# Patient Record
Sex: Male | Born: 1965 | Race: Black or African American | Hispanic: No | Marital: Single | Smoking: Never smoker
Health system: Southern US, Community
[De-identification: ages and names within clinical notes are randomized; demographics above are authoritative.]

---

## 2011-10-11 ENCOUNTER — Encounter (HOSPITAL_COMMUNITY): Payer: Self-pay | Admitting: Emergency Medicine

## 2011-10-11 ENCOUNTER — Emergency Department (HOSPITAL_COMMUNITY)
Admission: EM | Admit: 2011-10-11 | Discharge: 2011-10-11 | Disposition: A | Discharge status: Home / Self Care | Payer: Self-pay | Source: Home / Self Care | Attending: Family Medicine | Admitting: Family Medicine

## 2011-10-11 DIAGNOSIS — B354 Tinea corporis: Secondary | ICD-10-CM

## 2011-10-11 MED ORDER — KETOCONAZOLE 2 % EX CREA: TOPICAL_CREAM | Freq: Every day | CUTANEOUS | Status: AC

## 2011-10-11 MED ORDER — FLUCONAZOLE 150 MG PO TABS: ORAL_TABLET | ORAL | Status: AC

## 2011-10-11 NOTE — Discharge Instructions (Signed)
Use over-the-counter red top Selsun Blue shampoo apply in the affected areas and let stay overnight rinse in the morning dry well and apply the prescribed cream. Take the prescribed medications as instructed. Followup with a dermatologist if persistent rash despite completing treatment or followup earlier if worsening symptoms at any point.

## 2011-10-11 NOTE — ED Notes (Signed)
Pt has darkened rash areas on skin, there are no raised places, some places appear dry. Pt states it has been there for about 4 weeks, but has gotten slowly worse. Pt states he has eliminated or changed all soaps and lotions. No c/o pain or itching.

## 2011-10-13 NOTE — ED Provider Notes (Signed)
History     CSN: [id number]  Arrival date & time 10/11/11  1653   First MD Initiated Contact with Patient 10/11/11 1711      Chief Complaint  Patient presents with  . Rash    (Consider location/radiation/quality/duration/timing/severity/associated sxs/prior treatment) HPI Comments: 46 y/o male no significant PMH. Here c/o rash in arms and torso for about 4 weeks, minimal itching. Getting worse. No fever or chills. No general malaise. No known chemical exposure. No new drugs. Otherwise feeling well. No using any medication to treat his rash.    History reviewed. No pertinent past medical history.  History reviewed. No pertinent past surgical history.  History reviewed. No pertinent family history.  History  Substance Use Topics  . Smoking status: Never Smoker   . Smokeless tobacco: Not on file  . Alcohol Use: Yes     occasional      Review of Systems  Constitutional: Negative for fever and chills.  HENT: Negative for sore throat and trouble swallowing.   Respiratory: Negative for cough and shortness of breath.   Musculoskeletal: Negative for myalgias and arthralgias.  Skin: Positive for rash.       As per HPI  Neurological: Negative for headaches.  All other systems reviewed and are negative.    Allergies  Review of patient's allergies indicates no known allergies.  Home Medications   Current Outpatient Rx  Name Route Sig Dispense Refill  . FLUCONAZOLE 150 MG PO TABS  1 tab po weekly x4 4 tablet 0  . KETOCONAZOLE 2 % EX CREA Topical Apply topically daily. 15 g 0    BP 120/71  Pulse 94  Temp(Src) 98.8 F (37.1 C) (Oral)  Resp 18  SpO2 98%  Physical Exam  Nursing note and vitals reviewed. Constitutional: He is oriented to person, place, and time. He appears well-developed and well-nourished. No distress.  HENT:  Head: Normocephalic and atraumatic.  Mouth/Throat: Oropharynx is clear and moist. No oropharyngeal exudate.  Eyes: Conjunctivae are normal.  Pupils are equal, round, and reactive to light.  Neck: Neck supple.  Cardiovascular: Normal heart sounds.   Pulmonary/Chest: Breath sounds normal.  Abdominal: Soft. There is no tenderness.  Lymphadenopathy:    He has no cervical adenopathy.  Neurological: He is alert and oriented to person, place, and time.  Skin:       Macular round hyperpigmented lesions in sun exposed area. Confluent in mid upper back and V neck and anterior chest area. In some area lesions are dry and peeling.     ED Course  Procedures (including critical care time)  Labs Reviewed - No data to display No results found.   1. Tinea corporis       MDM  Treated with diflucan, ketoconazole and supportive care recommendations.        Sharin Grave, MD 10/14/11 [phone]

## 2021-07-05 ENCOUNTER — Ambulatory Visit: Admit: 2021-07-05 | Discharge status: Absent without leave | Payer: Self-pay

## 2021-07-06 ENCOUNTER — Ambulatory Visit
Admission: RE | Admit: 2021-07-06 | Discharge: 2021-07-06 | Disposition: A | Discharge status: Home / Self Care | Payer: Medicaid Other | Source: Ambulatory Visit | Attending: Physician Assistant | Admitting: Physician Assistant

## 2021-07-06 ENCOUNTER — Encounter (HOSPITAL_COMMUNITY): Payer: Self-pay | Admitting: Registered Nurse

## 2021-07-06 ENCOUNTER — Ambulatory Visit (HOSPITAL_COMMUNITY)
Admission: EM | Admit: 2021-07-06 | Discharge: 2021-07-06 | Disposition: A | Discharge status: Home / Self Care | Payer: No Payment, Other | Attending: Registered Nurse | Admitting: Registered Nurse

## 2021-07-06 ENCOUNTER — Other Ambulatory Visit: Payer: Self-pay

## 2021-07-06 VITALS — BP 135/87 | HR 90 | Temp 99.1°F | Resp 18

## 2021-07-06 DIAGNOSIS — R109 Unspecified abdominal pain: Secondary | ICD-10-CM

## 2021-07-06 DIAGNOSIS — Z634 Disappearance and death of family member: Secondary | ICD-10-CM | POA: Insufficient documentation

## 2021-07-06 DIAGNOSIS — Z636 Dependent relative needing care at home: Secondary | ICD-10-CM | POA: Insufficient documentation

## 2021-07-06 DIAGNOSIS — F4323 Adjustment disorder with mixed anxiety and depressed mood: Secondary | ICD-10-CM | POA: Diagnosis present

## 2021-07-06 NOTE — Discharge Instructions (Signed)
°  Please use your MyChart to find/ set up appointment with PCP in the near future.     You can visit the behavioral health urgent care at :  [address] [place], [place] [address] Phone: [phone]

## 2021-07-06 NOTE — ED Provider Notes (Signed)
Behavioral Health Urgent Care Medical Screening Exam  Patient Name: Chad Maiserrence Lingo MRN: [id number] Date of Evaluation: 07/06/21 Chief Complaint:   Diagnosis:  Final diagnoses:  Adjustment disorder with mixed anxiety and depressed mood    History of Present illness: Chad Keller is a 56 y.o. male patient [facility] voluntarily as a walk in.    Chad Keller, 56 y.o., male patient seen face to face by this provider, consulted with Dr. Earlene PlaterKatherine Laubach; and chart reviewed on 07/06/21.  On evaluation Chad Keller reports he was [facility] for abdominal discomfort.  Reported having anxiety and was suggested to follow up with outpatient psychiatric services.   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  Reports his mother died last year from abdominal cancer and never really had time to grieve her loss.  States immediately after the death of his mother his brother who was diagnosed with schizophrenia mood began and he became his caregiver.  Reports he has 3 children that are doing well a son who is currently living with his ex-wife who is doing well but at times may need him to do things and with the being an hour away having to stop what he is doing to go help.  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  So I just dropped everything hit up there and take him to school."  Patient reporting that he has had worsening anxiety but has never sought any help.  Reports he has talked to friends about what they did for the anxiety and was recommended to see a doctor for medication and therapy.  Patient denies prior psychiatric history (no outpatient psychiatric services, psychiatric hospitalizations, no prior self-harm behaviors, or suicide  attempt).  Patient reports he is going to get established with outpatient psychiatric services for medication management and counseling. During evaluation Chad Keller is sitting upright in chair in no acute distress.  He is alert/oriented x 4; calm/cooperative; and mood congruent with affect.  He is speaking in a clear tone at moderate volume, and normal pace; with good eye contact.  His thought process is coherent and relevant; There is no indication that he is currently responding to internal/external stimuli or experiencing delusional thought content; and he denies suicidal/self-harm/homicidal ideation, psychosis, and paranoia.  Patient has remained calm throughout assessment and has answered questions appropriately.    At this time Chad Keller is educated and verbalizes understanding of mental health resources and other crisis services in the community. He is instructed to call 911 and present to the nearest emergency room should he experience any suicidal/homicidal ideation, auditory/visual/hallucinations, or detrimental worsening of his mental health condition.  He was a also advised by Clinical research associatewriter that he could call the toll-free phone on insurance card to assist with identifying in network counselors and agencies or number on back of Medicaid card to speak with care coordinator    Psychiatric Specialty Exam  Presentation  General Appearance:Appropriate for Environment; Casual  Eye Contact:Good  Speech:Clear and Coherent; Normal Rate  Speech Volume:Normal  Handedness:Right   Mood and Affect  Mood:Anxious; Euthymic  Affect:Appropriate; Congruent   Thought Process  Thought Processes:Coherent; Goal Directed  Descriptions of Associations:Intact  Orientation:Full (Time, Place and Person)  Thought Content:Logical; WDL    Hallucinations:None  Ideas of Reference:None  Suicidal Thoughts:No  Homicidal Thoughts:No   Sensorium  Memory:Immediate Good; Recent Good; Remote  Good  Judgment:Intact  Insight:Present   Executive Functions  Concentration:Good  Attention Span:Good  [place]  Language:Good   Psychomotor Activity  Psychomotor Activity:Normal   Assets  Assets:Communication Skills; Desire for Improvement; Housing; Resilience; Social Support   Sleep  Sleep:Fair  Number of hours: No data recorded  Nutritional Assessment (For OBS and FBC admissions only) Has the patient had a weight loss or gain of 10 pounds or more in the last 3 months?: No Has the patient had a decrease in food intake/or appetite?: No Does the patient have dental problems?: No Does the patient have eating habits or behaviors that may be indicators of an eating disorder including binging or inducing vomiting?: No Has the patient recently lost weight without trying?: 0 Has the patient been eating poorly because of a decreased appetite?: 0 Malnutrition Screening Tool Score: 0    Physical Exam: Physical Exam Vitals and nursing note reviewed. Exam conducted with a chaperone present.  Constitutional:      General: He is not in acute distress.    Appearance: Normal appearance. He is not ill-appearing.  Cardiovascular:     Rate and Rhythm: Normal rate.  Pulmonary:     Effort: Pulmonary effort is normal.  Musculoskeletal:        General: Normal range of motion.     Cervical back: Normal range of motion.  Skin:    General: Skin is warm and dry.  Neurological:     Mental Status: He is alert and oriented to person, place, and time.  Psychiatric:        Attention and Perception: Attention and perception normal. He does not perceive auditory or visual hallucinations.        Mood and Affect: Affect normal. Mood is anxious.        Speech: Speech normal.        Behavior: Behavior normal. Behavior is cooperative.        Thought Content: Thought content normal. Thought content is not paranoid or delusional. Thought content does not include  homicidal or suicidal ideation.        Cognition and Memory: Cognition and memory normal.        Judgment: Judgment normal.   Review of Systems  Constitutional: Negative.   HENT: Negative.    Eyes: Negative.   Respiratory: Negative.    Cardiovascular: Negative.   Gastrointestinal:  Positive for abdominal pain.       Complaints of abdominal discomfort that started about a month ago.  Genitourinary: Negative.   Musculoskeletal: Negative.   Skin: Negative.   Neurological: Negative.   Endo/Heme/Allergies: Negative.   Blood pressure 118/86, pulse 80, temperature 98.6 F (37 C), temperature source Oral, resp. rate 18, SpO2 97 %. There is no height or weight on file to calculate BMI.  Musculoskeletal: Strength & Muscle Tone: within normal limits Gait & Station: normal Patient leans: N/A   [place] [facility] Discharge Disposition for Follow up and Recommendations: Based on my evaluation the patient does not appear to have an emergency medical condition and can be discharged with resources and follow up care in outpatient services for Medication Management, Individual Therapy, Group Therapy, and Intensive outpatient program   Follow-up Information     Call  [place]: Professional Counselor Why: schedule an appointment for medication management and therapy Contact information: [facility] [place] [address] [place]   Specialty: Urgent Care Why: New Therapy walkin:  Monday-Wednesday from 7:30am-12:30pm.  New Medication management walkin Monday-Friday from 7:30 am to 11:00am.  Patient will be taken in the order that they come.  You may not be seen on the same day as walkin. first come first Midwife information: Rico Radcliff                 Discharge Instructions      [facility]:  First come first serve.  If you arrive to late.  Ask if you can put your name down in walk in slot for next.    Outpatient treatment is recommended:  [facility] (2nd floor) [address] [phone]  [facility] [place], [place] [address] [phone]  [facility] [address] [place], [place] [address] Phone: [phone]        Earleen Newport, NP 07/06/2021, 6:22 PM

## 2021-07-06 NOTE — Discharge Instructions (Addendum)
[facility]:  First come first serve.  If you arrive to late.  Ask if you can put your name down in walk in slot for next.    Outpatient treatment is recommended:  [facility] (2nd floor) [address] [phone]  [facility] [address] [place], [place] [address] [phone]  [facility] [address] [place], [place] [address] Phone: [phone]

## 2021-07-06 NOTE — Progress Notes (Signed)
°   07/06/21 1746  [facility] Triage Screening (Walk-ins at [facility] only)  How Did You Hear About [place]? Self  What Is the Reason for Your Visit/Call Today? Patient presents at the recommendation of [facility].  He had presented there with report of abdominal pain.  Patient lost his mother to stomach cancer and he has experienced significant anxiety related to the possiblility of a cancer diagnosis.  He has also had to take on care of his brother who is diagnosed with Schizophrenis since their mother passed away.  He shares that he has a lot going on with his children in the midst of losing his mother and dealing with his brother.  Two daughters have graduated college and he helps a great deal with his son who now lives with his mother in [place] and is a Holiday representative in high school.  Patient spoke with the UC provider about possible medications for anxiety and he was referred here.  Patient denies SI, HI, AVH or SA hx.  He is open to outpatient treatment recommendations.  How Long Has This Been Causing You Problems? > than 6 months  Have You Recently Had Any Thoughts About Hurting Yourself? No  Are You Planning to Commit Suicide/Harm Yourself At This time? No  Have you Recently Had Thoughts About Hurting Someone Karolee Ohs? No  Are You Planning To Harm Someone At This Time? No  Are you currently experiencing any auditory, visual or other hallucinations? No  Have You Used Any Alcohol or Drugs in the Past 24 Hours? No  Do you have any current medical co-morbidities that require immediate attention? No  Clinician description of patient physical appearance/behavior: Patient is calm, cooperative and pleasant, AAOx5  What Do You Feel Would Help You the Most Today? Treatment for Depression or other mood problem  If access to [facility] was not available, would you have sought care in the Emergency Department? No  Determination of Need Routine (7 days)  Options For Referral Medication  Management;Outpatient Therapy

## 2021-07-06 NOTE — ED Triage Notes (Signed)
Pt c/o:  Abdominal "gurgling" "growling" and "tweak in my back" (pointing to right lower back).   Mentions hx of gallstones and several recent stressors in life. States mother passed away of tumor in abd. Pt appears anxious of this scenario.   States his sx have been going on for a few months.

## 2021-07-06 NOTE — ED Notes (Signed)
Patient received After visit summary with community resources for follow up care. Patient verbalized understanding. Patient received all belongings from locker. Patient denies SI, Hi and AVH at time of discharge.

## 2021-07-06 NOTE — ED Provider Notes (Signed)
[facility]    CSN: [id number] Arrival date & time: 07/06/21  1459      History   Chief Complaint Chief Complaint  Patient presents with   Abdominal Pain    HPI Teagan Heidrick is a 56 y.o. male.   Patient here today for evaluation of abdominal discomfort that started a few months ago. He denies pain but reports more of a constant "gurgling" or "growling". He has not had any nausea, vomiting, diarrhea or constipation. He denies blood in his stool. He has not taken any medication for symptoms. He has history of gallstones. He is most concerned because his mother passed from abdominal tumor recently. (Leiomyosarcoma)   He also reports anxiety and would like to consider initiating treatment for same.   The history is provided by the patient.  Abdominal Pain Associated symptoms: no chills, no constipation, no diarrhea, no fever, no nausea, no shortness of breath and no vomiting    History reviewed. No pertinent past medical history.  There are no problems to display for this patient.   History reviewed. No pertinent surgical history.     Home Medications    Prior to Admission medications   Medication Sig Start Date End Date Taking? Authorizing Provider  fluconazole (DIFLUCAN) 150 MG tablet 1 tab po weekly x4 10/11/11   Moreno-Coll, Adlih, MD    Family History History reviewed. No pertinent family history.  Social History Social History   Tobacco Use   Smoking status: Never  Substance Use Topics   Alcohol use: Yes    Comment: occasional   Drug use: No     Allergies   Patient has no known allergies.   Review of Systems Review of Systems  Constitutional:  Negative for chills and fever.  Eyes:  Negative for discharge and redness.  Respiratory:  Negative for shortness of breath.   Gastrointestinal:  Positive for abdominal pain. Negative for blood in stool, constipation, diarrhea, nausea and vomiting.  Skin:  Positive for color change and wound.   Neurological:  Negative for numbness.    Physical Exam Triage Vital Signs ED Triage Vitals  Enc Vitals Group     BP 07/06/21 1515 135/87     Pulse Rate 07/06/21 1515 90     Resp 07/06/21 1515 18     Temp 07/06/21 1515 99.1 F (37.3 C)     Temp Source 07/06/21 1515 Oral     SpO2 07/06/21 1515 98 %     Weight --      Height --      Head Circumference --      Peak Flow --      Pain Score 07/06/21 1514 0     Pain Loc --      Pain Edu? --      Excl. in GC? --    No data found.  Updated Vital Signs BP 135/87 (BP Location: Left Arm)    Pulse 90    Temp 99.1 F (37.3 C) (Oral)    Resp 18    SpO2 98%      Physical Exam Vitals and nursing note reviewed.  Constitutional:      General: He is not in acute distress.    Appearance: Normal appearance. He is not ill-appearing.  HENT:     Head: Normocephalic and atraumatic.  Eyes:     Conjunctiva/sclera: Conjunctivae normal.  Cardiovascular:     Rate and Rhythm: Normal rate and regular rhythm.     Heart sounds:  Normal heart sounds. No murmur heard. Pulmonary:     Effort: Pulmonary effort is normal. No respiratory distress.     Breath sounds: Normal breath sounds. No wheezing, rhonchi or rales.  Abdominal:     General: Abdomen is flat. Bowel sounds are normal. There is no distension.     Palpations: Abdomen is soft.     Tenderness: There is no abdominal tenderness. There is no guarding or rebound.  Neurological:     Mental Status: He is alert.  Psychiatric:        Mood and Affect: Mood normal.        Behavior: Behavior normal.        Thought Content: Thought content normal.     UC Treatments / Results  Labs (all labs ordered are listed, but only abnormal results are displayed) Labs Reviewed  CBC WITH DIFFERENTIAL/PLATELET  COMPREHENSIVE METABOLIC PANEL  AMYLASE  LIPASE    EKG   Radiology No results found.  Procedures Procedures (including critical care time)  Medications Ordered in UC Medications - No data  to display  Initial Impression / Assessment and Plan / UC Course  I have reviewed the triage vital signs and the nursing notes.  Pertinent labs & imaging results that were available during my care of the patient were reviewed by me and considered in my medical decision making (see chart for details).   Seemingly benign abdominal symptoms but will order labs for further recommendation. I encouraged patient to establish with PCP as well. Contact information provided for behavioral health urgent care for further anxiety management. Encouraged follow up with any further concerns.   Final Clinical Impressions(s) / UC Diagnoses   Final diagnoses:  Abdominal discomfort     Discharge Instructions       Please use your MyChart to find/ set up appointment with PCP in the near future.     You can visit the behavioral health urgent care at :  [address] [place], [place] [address] Phone: [phone]    ED Prescriptions   None    PDMP not reviewed this encounter.   Tomi Bamberger, PA-C 07/06/21 1701

## 2021-07-07 LAB — CBC WITH DIFFERENTIAL/PLATELET
Basophils Absolute: 0.1 10*3/uL (ref 0.0–0.2)
Basos: 1 %
EOS (ABSOLUTE): 0.2 10*3/uL (ref 0.0–0.4)
Eos: 3 %
Hematocrit: 44.5 % (ref 37.5–51.0)
Hemoglobin: 14.9 g/dL (ref 13.0–17.7)
Immature Grans (Abs): 0 10*3/uL (ref 0.0–0.1)
Immature Granulocytes: 0 %
Lymphocytes Absolute: 2.6 10*3/uL (ref 0.7–3.1)
Lymphs: 50 %
MCH: 28.4 pg (ref 26.6–33.0)
MCHC: 33.5 g/dL (ref 31.5–35.7)
MCV: 85 fL (ref 79–97)
Monocytes Absolute: 0.4 10*3/uL (ref 0.1–0.9)
Monocytes: 8 %
Neutrophils Absolute: 2 10*3/uL (ref 1.4–7.0)
Neutrophils: 38 %
Platelets: 196 10*3/uL (ref 150–450)
RBC: 5.25 x10E6/uL (ref 4.14–5.80)
RDW: 13.1 % (ref 11.6–15.4)
WBC: 5.2 10*3/uL (ref 3.4–10.8)

## 2021-07-07 LAB — COMPREHENSIVE METABOLIC PANEL
ALT: 27 IU/L (ref 0–44)
AST: 19 IU/L (ref 0–40)
Albumin/Globulin Ratio: 1.2 (ref 1.2–2.2)
Albumin: 4.4 g/dL (ref 3.8–4.9)
Alkaline Phosphatase: 71 IU/L (ref 44–121)
BUN/Creatinine Ratio: 12 (ref 9–20)
BUN: 12 mg/dL (ref 6–24)
Bilirubin Total: 0.3 mg/dL (ref 0.0–1.2)
CO2: 22 mmol/L (ref 20–29)
Calcium: 9.4 mg/dL (ref 8.7–10.2)
Chloride: 102 mmol/L (ref 96–106)
Creatinine, Ser: 0.99 mg/dL (ref 0.76–1.27)
Globulin, Total: 3.6 g/dL (ref 1.5–4.5)
Glucose: 106 mg/dL — ABNORMAL HIGH (ref 70–99)
Potassium: 3.7 mmol/L (ref 3.5–5.2)
Sodium: 139 mmol/L (ref 134–144)
Total Protein: 8 g/dL (ref 6.0–8.5)
eGFR: 91 mL/min/{1.73_m2} (ref >59)

## 2021-07-07 LAB — AMYLASE: Amylase: 68 U/L (ref 31–110)

## 2021-07-07 LAB — LIPASE: Lipase: 19 U/L (ref 13–78)

## 2021-07-08 ENCOUNTER — Ambulatory Visit (INDEPENDENT_AMBULATORY_CARE_PROVIDER_SITE_OTHER): Payer: No Payment, Other | Admitting: Licensed Clinical Social Worker

## 2021-07-08 ENCOUNTER — Other Ambulatory Visit: Payer: Self-pay

## 2021-07-08 DIAGNOSIS — Z634 Disappearance and death of family member: Secondary | ICD-10-CM

## 2021-07-08 DIAGNOSIS — F411 Generalized anxiety disorder: Secondary | ICD-10-CM | POA: Diagnosis not present

## 2021-07-12 ENCOUNTER — Other Ambulatory Visit: Payer: Self-pay

## 2021-07-12 ENCOUNTER — Ambulatory Visit (INDEPENDENT_AMBULATORY_CARE_PROVIDER_SITE_OTHER): Payer: No Payment, Other | Admitting: Psychiatry

## 2021-07-12 ENCOUNTER — Encounter (HOSPITAL_COMMUNITY): Payer: Self-pay | Admitting: Psychiatry

## 2021-07-12 DIAGNOSIS — F411 Generalized anxiety disorder: Secondary | ICD-10-CM | POA: Diagnosis not present

## 2021-07-12 MED ORDER — HYDROXYZINE HCL 10 MG PO TABS: 10.0000 mg | ORAL_TABLET | Freq: Three times a day (TID) | ORAL | 0 refills | Status: DC | PRN

## 2021-07-12 MED ORDER — FLUOXETINE HCL 10 MG PO CAPS: 10.0000 mg | ORAL_CAPSULE | Freq: Every day | ORAL | 2 refills | Status: DC

## 2021-07-12 MED ORDER — HYDROXYZINE HCL 10 MG PO TABS: 10.0000 mg | ORAL_TABLET | Freq: Three times a day (TID) | ORAL | 3 refills | Status: DC | PRN

## 2021-07-12 NOTE — Progress Notes (Signed)
Psychiatric Initial Adult Assessment  Virtual Visit via Video Note  I connected with Chad Maiserrence Mendez on 07/12/21 at  8:00 AM EST by a video enabled telemedicine application and verified that I am speaking with the correct person using two identifiers.  Location: Patient: Home Provider: Clinic   I discussed the limitations of evaluation and management by telemedicine and the availability of in person appointments. The patient expressed understanding and agreed to proceed.  I provided 45 minutes of non-face-to-face time during this encounter.   Patient Identification: Chad Keller MRN:  [id number] Date of Evaluation:  07/12/2021 Referral Source: [facility] Complaint:  "When It comes to work or studying I start worrying" Visit Diagnosis:    ICD-10-CM   1. Generalized anxiety disorder  F41.1 FLUoxetine (PROZAC) 10 MG capsule    hydrOXYzine (ATARAX) 10 MG tablet      History of Present Illness:  56 year old male seen today for initial psychiatric evaluation. [facility] presented with increased anxiety and symptoms of adjustment disorder. Currently he is not managed on medications.   Today he is well groomed, pleasant, cooperative,  engaged in conversation, and maintained eye contact. He informed Clinical research associatewriter that at work or in his quite study times he has increased anxiety and worry. He notes that he worries about his three children and completing task at work. Patient notes that he took some time off to care for his brother who has schizophrenia and notes at times this is overwhelming. He did inform provider that his brother will be moving into his own apartment soon which will reduce some of his stress. 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 Today provider conducted a GAD 7 and patient scored a 14. Provider also conducted a PHQ 9 and patient scored a 5. He endorses  adequate sleep at appetite. Today he denies SI/HI/VAH or mania. Patient reports at times he feels paranoid in his home. He reports that he believes someone may break in or his brother will roam and leave. He notes when he first started caring for his brother he would sleep on the couch to insure he and his brother were safe. He notes that he continuously checks windows and doors.   Patient reports that he cared for his mother until she passed away last year to cancer. He reports that this was traumatic. He denies flashbacks, nightmares or avoidant behaviors.   Today he is agreeable to starting Prozac 10 mg to help manage anxiety. He is also agreeable to start hydroxyzine 10 mg three times daily as needed to help manages anxiety. Potential side effects of medication and risks vs benefits of treatment vs non-treatment were explained and discussed. All questions were answered. No other concerns noted at this time.   Associated Signs/Symptoms: Depression Symptoms:  fatigue, difficulty concentrating, disturbed sleep, (Hypo) Manic Symptoms:  Flight of Ideas, Irritable Mood, Anxiety Symptoms:  Excessive Worry, Obsessive Compulsive Symptoms:   Checking, Counting,, Psychotic Symptoms:   Notes that he feels that people maybe breaking into home PTSD Symptoms: Had a traumatic exposure:  Mother died of cancer last year  Past Psychiatric History: adjustment  Previous Psychotropic Medications: No   Substance Abuse History in the last 12 months:  No.  Consequences of Substance Abuse: NA  Past Medical History: No past medical history on file. No past surgical history on file.  Family Psychiatric History: Brother schizophrenia  Family History: No family history on file.  Social History:   Social History   Socioeconomic History   Marital status: Single    Spouse name: Not on file   Number of children: Not on file   Years of education: Not on file   Highest education level: Not on file   Occupational History   Not on file  Tobacco Use   Smoking status: Never   Smokeless tobacco: Not on file  Substance and Sexual Activity   Alcohol use: Yes    Comment: occasional   Drug use: No   Sexual activity: Never  Other Topics Concern   Not on file  Social History Narrative   Not on file   Social Determinants of Health   Financial Resource Strain: Not on file  Food Insecurity: Not on file  Transportation Needs: Not on file  Physical Activity: Not on file  Stress: Not on file  Social Connections: Not on file    Additional Social History: Patient resides in [place] with his brother. He is divorced and has 3 children (26 and 19 year old daughters and a 44 year old son). 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 She denies alcohol and illegal drug use. He note he drink socially.   Allergies:  No Known Allergies  Metabolic Disorder Labs: No results found for: HGBA1C, MPG No results found for: PROLACTIN No results found for: CHOL, TRIG, HDL, CHOLHDL, VLDL, LDLCALC No results found for: TSH  Therapeutic Level Labs: No results found for: LITHIUM No results found for: CBMZ No results found for: VALPROATE  Current Medications: Current Outpatient Medications  Medication Sig Dispense Refill   FLUoxetine (PROZAC) 10 MG capsule Take 1 capsule (10 mg total) by mouth daily. 30 capsule 2   fluconazole (DIFLUCAN) 150 MG tablet 1 tab po weekly x4 4 tablet 0   hydrOXYzine (ATARAX) 10 MG tablet Take 1 tablet (10 mg total) by mouth 3 (three) times daily as needed. 90 tablet 3   No current facility-administered medications for this visit.    Musculoskeletal: Strength & Muscle Tone:  Unable to assess due to telehealth visit Gait & Station:  Unable to assess due to telehealth visit Patient leans: N/A  Psychiatric Specialty Exam: Review of Systems  There were no vitals taken for this  visit.There is no height or weight on file to calculate BMI.  General Appearance: Well Groomed  Eye Contact:  Good  Speech:  Clear and Coherent and Normal Rate  Volume:  Normal  Mood:  Anxious  Affect:  Appropriate and Congruent  Thought Process:  Coherent, Goal Directed, and Linear  Orientation:  Full (Time, Place, and Person)  Thought Content:  WDL and Logical  Suicidal Thoughts:  No  Homicidal Thoughts:  No  Memory:  Immediate;   Good Recent;   Good Remote;   Good  Judgement:  Good  Insight:  Good  Psychomotor Activity:  Normal  Concentration:  Concentration: Good and Attention Span: Good  Recall:  Good  Fund of Knowledge:Good  Language: Good  Akathisia:  No  Handed:  Right  AIMS (if indicated):  not done  Assets:  Communication Skills Desire for Improvement Financial Resources/Insurance Housing Physical Health Social Support  ADL's:  Intact  Cognition: WNL  Sleep:  Good   Screenings: GAD-7    Flowsheet Row Office Visit from 07/12/2021 in [facility]  Total GAD-7  Score 14      PHQ2-9    Flowsheet Row Office Visit from 07/12/2021 in [facility] Counselor from 07/08/2021 in [facility]  PHQ-2 Total Score 0 1  PHQ-9 Total Score 5 --      Flowsheet Row Office Visit from 07/12/2021 in [facility] Counselor from 07/08/2021 in [facility] ED from 07/06/2021 in [facility]   C-SSRS RISK CATEGORY No Risk No Risk No Risk       Assessment and Plan: Patient endorses symptoms of anxiety. Today he is agreeable to starting Prozac 10 mg to help manage anxiety. He is also agreeable to starting hydroxyzine 10 mg three times daily if needed.   1. Generalized anxiety disorder  Start- FLUoxetine (PROZAC) 10 MG capsule; Take 1 capsule (10 mg total) by mouth daily.  Dispense: 30 capsule; Refill: 2 Start-  hydrOXYzine (ATARAX) 10 MG tablet; Take 1 tablet (10 mg total) by mouth 3 (three) times daily as needed.  Dispense: 90 tablet; Refill: 3   Follow up in 3 months  Shanna Cisco, NP 1/17/20238:43 AM
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
## 2021-07-19 ENCOUNTER — Telehealth (HOSPITAL_COMMUNITY): Payer: Self-pay

## 2021-07-19 NOTE — BH Assessment (Signed)
Care Management - Reader Follow Up Discharges   Writer attempted to make contact with patient today and was unsuccessful.  Writer left a HIPPA compliant voice message.   Per chart review, has a follow up appointment on 07-12-20 and 09-22-21 with Eulis Canner, NP and 08-08-21 with Gwyneth Revels, LCSW on 08-08-21.

## 2021-08-08 ENCOUNTER — Encounter (HOSPITAL_COMMUNITY): Payer: Self-pay

## 2021-08-08 ENCOUNTER — Ambulatory Visit (HOSPITAL_COMMUNITY): Payer: Self-pay | Admitting: Licensed Clinical Social Worker

## 2021-08-23 ENCOUNTER — Ambulatory Visit (HOSPITAL_COMMUNITY): Payer: No Payment, Other | Admitting: Licensed Clinical Social Worker

## 2021-08-29 ENCOUNTER — Emergency Department (HOSPITAL_COMMUNITY): Payer: No Typology Code available for payment source

## 2021-08-29 ENCOUNTER — Emergency Department (HOSPITAL_COMMUNITY)
Admission: EM | Admit: 2021-08-29 | Discharge: 2021-08-29 | Disposition: A | Discharge status: Home / Self Care | Payer: No Typology Code available for payment source | Attending: Emergency Medicine | Admitting: Emergency Medicine

## 2021-08-29 ENCOUNTER — Encounter (HOSPITAL_COMMUNITY): Payer: Self-pay

## 2021-08-29 ENCOUNTER — Other Ambulatory Visit: Payer: Self-pay

## 2021-08-29 DIAGNOSIS — M25512 Pain in left shoulder: Secondary | ICD-10-CM | POA: Insufficient documentation

## 2021-08-29 DIAGNOSIS — M25532 Pain in left wrist: Secondary | ICD-10-CM | POA: Diagnosis not present

## 2021-08-29 DIAGNOSIS — R0789 Other chest pain: Secondary | ICD-10-CM | POA: Insufficient documentation

## 2021-08-29 DIAGNOSIS — M549 Dorsalgia, unspecified: Secondary | ICD-10-CM | POA: Diagnosis not present

## 2021-08-29 DIAGNOSIS — Y92410 Unspecified street and highway as the place of occurrence of the external cause: Secondary | ICD-10-CM | POA: Diagnosis not present

## 2021-08-29 MED ORDER — LIDOCAINE 5 % EX PTCH
1.0000 | MEDICATED_PATCH | CUTANEOUS | Status: DC
  Administered 2021-08-29: 1 via TRANSDERMAL
  Filled 2021-08-29: qty 1
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
## 2021-08-29 NOTE — Discharge Instructions (Addendum)
The x-rays did not show any signs of injury.  Take Tylenol Motrin as needed for pain.  Return if things significantly change or worsen, information provided above for primary if he needs 1.  Expect to have some pain in your neck and your back over the next few days.  It may take up to 2 weeks to fully resolve. ?
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
## 2021-09-02 ENCOUNTER — Ambulatory Visit (INDEPENDENT_AMBULATORY_CARE_PROVIDER_SITE_OTHER): Payer: No Payment, Other | Admitting: Licensed Clinical Social Worker

## 2021-09-02 DIAGNOSIS — Z634 Disappearance and death of family member: Secondary | ICD-10-CM

## 2021-09-02 DIAGNOSIS — F411 Generalized anxiety disorder: Secondary | ICD-10-CM
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
## 2021-09-19 ENCOUNTER — Ambulatory Visit (INDEPENDENT_AMBULATORY_CARE_PROVIDER_SITE_OTHER): Payer: No Payment, Other | Admitting: Licensed Clinical Social Worker

## 2021-09-19 DIAGNOSIS — F411 Generalized anxiety disorder: Secondary | ICD-10-CM
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
## 2021-09-22 ENCOUNTER — Telehealth (INDEPENDENT_AMBULATORY_CARE_PROVIDER_SITE_OTHER): Payer: No Payment, Other | Admitting: Psychiatry

## 2021-09-22 ENCOUNTER — Encounter (HOSPITAL_COMMUNITY): Payer: Self-pay | Admitting: Psychiatry

## 2021-09-22 DIAGNOSIS — F411 Generalized anxiety disorder: Secondary | ICD-10-CM

## 2021-09-22 MED ORDER — HYDROXYZINE HCL 10 MG PO TABS: 10.0000 mg | ORAL_TABLET | Freq: Three times a day (TID) | ORAL | 3 refills | Status: DC | PRN

## 2021-09-22 MED ORDER — FLUOXETINE HCL 10 MG PO CAPS: 10.0000 mg | ORAL_CAPSULE | Freq: Every day | ORAL | 2 refills | Status: DC
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
## 2021-10-10 ENCOUNTER — Ambulatory Visit (HOSPITAL_COMMUNITY): Payer: Self-pay | Admitting: Licensed Clinical Social Worker

## 2021-10-24 ENCOUNTER — Encounter (HOSPITAL_COMMUNITY): Payer: Self-pay

## 2021-10-24 ENCOUNTER — Ambulatory Visit (HOSPITAL_COMMUNITY): Payer: No Payment, Other | Admitting: Licensed Clinical Social Worker

## 2021-11-30 ENCOUNTER — Encounter (HOSPITAL_COMMUNITY): Payer: Self-pay

## 2021-11-30 ENCOUNTER — Ambulatory Visit (INDEPENDENT_AMBULATORY_CARE_PROVIDER_SITE_OTHER): Payer: No Payment, Other | Admitting: Licensed Clinical Social Worker

## 2021-11-30 DIAGNOSIS — F4323 Adjustment disorder with mixed anxiety and depressed mood: Secondary | ICD-10-CM | POA: Diagnosis not present

## 2021-11-30 NOTE — Plan of Care (Signed)
  Problem: Anxiety Disorder CCP Problem  1 Learn and Apply Coping Skills to Decrease Anxiety Symptoms   Goal: LTG: Patient will score less than 5 on the Generalized Anxiety Disorder 7 Scale (GAD-7) Outcome: Not Applicable Goal: STG: Patient will participate in at least 80% of scheduled individual psychotherapy sessions Outcome: Not Applicable Goal: STG: Patient will complete at least 80% of assigned homework Outcome: Not Applicable   

## 2021-11-30 NOTE — Progress Notes (Signed)
THERAPIST PROGRESS NOTE  Session Time: 45 minutes  Participation Level: Active  Behavioral Response: CasualAlertEuthymic  Type of Therapy: Individual Therapy  Treatment Goals addressed: establish tx goals  ProgressTowards Goals: Initial (with this cln)  Interventions: Strength-based, Supportive, and Reframing  Summary: Chad Keller is a 56 y.o. male who presents for initial visit with this cln due to previous therapist resigning. He reports making significant progress since starting treatment but states he is still struggling with anxiety issues. He reports his mother died of cancer in 02-24-2022his father died within the past four years, and that his brother has paranoid schizophrenia. He explains he was his mother's caregiver when she was diagnosed with cancer and given 6 months to live. He states he has largely worked through grief and that his current stressors are helping his brother and work. He states all of his other relationships are good. He explains that his brother has his own apartment, but that lately he has been making comments that hint at wanting to move back in with pt. Pt reports he can't function with his brother living in his home due to unpredictability. Pt also states he wants to get back to work, but that his job was stressful. He reports difficulty finding a different job that pays as well as his previous job Producer, television/film/video, worked from home) and confirms the previous job could potentially be triggering due to his mother's cancer diagnoses. He states he doesn't know if he should go back to same job/field or start over with less pay. He states he might sell mother's house, where he's currently living, and move back to [place]. He expresses this would ensure it is more difficult for his brother to follow him and states he feels bad in saying this. Receptive to feedback from cln   Suicidal/Homicidal: Nowithout intent/plan  Therapist Response: Cln introduced self  and asked pt to identify pertinent background information, stressors, current symptoms, and treatment goals. Cln developed tx plan to assess progress based on pt's stated goals. Cln validated pt's feelings and asserted that his needs are understandable. Cln discussed the importance of pt prioritizing himself and his children and providing support to his brother while maintaining boundaries. Cln discussed that should pt's brother lose his apartment, he may need to live in a group home, as pt has clearly expressed that living with him is not an option. Cln stressed the importance of pt having this conversation with his brother, perhaps with an ACT team member present, to prepare him and potentially prevent or mitigate an extreme reaction. Cln also acknowledged that pt's mother has not been gone for very long and that it has still been a relatively short amount of time since losing his father and cln attempted to normalize ongoing grief and feelings of obligation toward his parents. Cln also noted that his parents would likely want him to do what he thinks is best for himself. Cln advised pt to schedule several f/u appointments with office staff to ensure availability of future sessions.  Plan: Return again in 9 weeks (next available appt).  Diagnosis: Adjustment disorder with mixed anxiety and depressed mood  Collaboration of Care: Other chart review  Patient/Guardian was advised Release of Information must be obtained prior to any record release in order to collaborate their care with an outside provider. Patient/Guardian was advised if they have not already done so to contact the registration department to sign all necessary forms in order for [place] to release information regarding their  care.   Consent: Patient/Guardian gives verbal consent for treatment and assignment of benefits for services provided during this visit. Patient/Guardian expressed understanding and agreed to proceed.   Wyvonnia Lora,  LCSWA 11/30/2021

## 2021-12-15 ENCOUNTER — Telehealth (INDEPENDENT_AMBULATORY_CARE_PROVIDER_SITE_OTHER): Payer: No Payment, Other | Admitting: Psychiatry

## 2021-12-15 ENCOUNTER — Encounter (HOSPITAL_COMMUNITY): Payer: Self-pay | Admitting: Psychiatry

## 2021-12-15 DIAGNOSIS — F411 Generalized anxiety disorder: Secondary | ICD-10-CM

## 2021-12-15 MED ORDER — FLUOXETINE HCL 10 MG PO CAPS: 10.0000 mg | ORAL_CAPSULE | Freq: Every day | ORAL | 3 refills | Status: DC

## 2021-12-15 MED ORDER — HYDROXYZINE HCL 10 MG PO TABS: 10.0000 mg | ORAL_TABLET | Freq: Three times a day (TID) | ORAL | 3 refills | Status: DC | PRN

## 2021-12-15 NOTE — Progress Notes (Signed)
BH MD/PA/NP OP Progress Note Virtual Visit via Telephone Note  I connected with Chad Keller on 12/15/21 at  1:00 PM EDT by telephone and verified that I am speaking with the correct person using two identifiers.  Location: Patient: home Provider: Clinic   I discussed the limitations, risks, security and privacy concerns of performing an evaluation and management service by telephone and the availability of in person appointments. I also discussed with the patient that there may be a patient responsible charge related to this service. The patient expressed understanding and agreed to proceed.   I provided 30 minutes of non-face-to-face time during this encounter.  12/15/2021 12:42 PM Chad Keller  MRN:  [id number]  Chief Complaint: "The meds are a life saver"  HPI: 56 year old male seen today for follow up psychiatric evaluation. He has a psychiatric history of adjustment disorder, anxiety, and depression. He is currently managed on hydroxyzine 10 mg three times daily as needed and Prozac 10 mg daily. He notes that his medications are effective in managing his psychiatric conditions.  Today patient was unable to logon virtually so his assessment was done over the phone.  During exam he was pleasant, cooperative, and engaged in conversation.  He informed Clinical research associate that his medications are lifesaver.  He notes that he wished that he had started medication years ago.  Patient informed writer that his mood is stable and notes that his anxiety and depression are manageable.  Provider conducted a GAD-7 and patient scored a 6.  He notes that he worries about his 3 children and his brother who has schizophrenia.  He endorses adequate sleep and appetite.  Today he denies SI/HI/VH, mania, or paranoia.  No medication changes made today.  Patient agreeable to continue medication as prescribed.  Visit Diagnosis:    ICD-10-CM   1. Generalized anxiety disorder  F41.1 FLUoxetine (PROZAC) 10 MG capsule     hydrOXYzine (ATARAX) 10 MG tablet      Past Psychiatric History: Adjustment disorder, anxiety, depression,  Past Medical History: History reviewed. No pertinent past medical history. History reviewed. No pertinent surgical history.  Family Psychiatric History: Brother schizophrenia  Family History: History reviewed. No pertinent family history.  Social History:  Social History   Socioeconomic History   Marital status: Single    Spouse name: Not on file   Number of children: Not on file   Years of education: Not on file   Highest education level: Not on file  Occupational History   Not on file  Tobacco Use   Smoking status: Never   Smokeless tobacco: Not on file  Substance and Sexual Activity   Alcohol use: Yes    Comment: occasional   Drug use: No   Sexual activity: Never  Other Topics Concern   Not on file  Social History Narrative   Not on file   Social Determinants of Health   Financial Resource Strain: Not on file  Food Insecurity: Not on file  Transportation Needs: Not on file  Physical Activity: Not on file  Stress: Not on file  Social Connections: Not on file    Allergies: No Known Allergies  Metabolic Disorder Labs: No results found for: "HGBA1C", "MPG" No results found for: "PROLACTIN" No results found for: "CHOL", "TRIG", "HDL", "CHOLHDL", "VLDL", "LDLCALC" No results found for: "TSH"  Therapeutic Level Labs: No results found for: "LITHIUM" No results found for: "VALPROATE" No results found for: "CBMZ"  Current Medications: Current Outpatient Medications  Medication Sig Dispense Refill  fluconazole (DIFLUCAN) 150 MG tablet 1 tab po weekly x4 4 tablet 0   FLUoxetine (PROZAC) 10 MG capsule Take 1 capsule (10 mg total) by mouth daily. 30 capsule 3   hydrOXYzine (ATARAX) 10 MG tablet Take 1 tablet (10 mg total) by mouth 3 (three) times daily as needed. 90 tablet 3   No current facility-administered medications for this visit.      Musculoskeletal: Strength & Muscle Tone:  Unable to assess due to telephone visit Gait & Station:  Unable to assess due to telephone visit Patient leans: N/A  Psychiatric Specialty Exam: Review of Systems  There were no vitals taken for this visit.There is no height or weight on file to calculate BMI.  General Appearance:  Unable to assess due to telephone visit  Eye Contact:   Unable to assess due to telephone visit  Speech:  Clear and Coherent and Normal Rate  Volume:  Normal  Mood:  Euthymic  Affect:  Appropriate and Congruent  Thought Process:  Coherent, Goal Directed, and Linear  Orientation:  Full (Time, Place, and Person)  Thought Content: WDL and Logical   Suicidal Thoughts:  No  Homicidal Thoughts:  No  Memory:  Immediate;   Good Recent;   Good Remote;   Good  Judgement:  Good  Insight:  Good  Psychomotor Activity:   Unable to assess due to telephone visit  Concentration:  Concentration: Good and Attention Span: Good  Recall:  Good  Fund of Knowledge: Good  Language: Good  Akathisia:  Unable to assess due to telephone visit  Handed:  Right  AIMS (if indicated): not done  Assets:  Communication Skills Desire for Improvement Financial Resources/Insurance [place] Talents/Skills Transportation  ADL's:  Intact  Cognition: WNL  Sleep:  Good   Screenings: GAD-7    Flowsheet Row Video Visit from 12/15/2021 in [facility] Office Visit from 07/12/2021 in [facility]  Total GAD-7 Score 6 14      PHQ2-9    Flowsheet Row Video Visit from 12/15/2021 in [facility] Office Visit from 07/12/2021 in [facility] Counselor from 07/08/2021 in [facility]  PHQ-2 Total Score 0 0 1  PHQ-9 Total Score 2 5 --      Flowsheet Row ED from 08/29/2021 in [facility] DEPT Office  Visit from 07/12/2021 in [facility] Counselor from 07/08/2021 in [place] No Risk No Risk No Risk        Assessment and Plan: Patient notes that he is doing well on his current medication regimen.  No medication changes made today.  Patient agreeable to continue medication as prescribed.  1. Generalized anxiety disorder  Continue- FLUoxetine (PROZAC) 10 MG capsule; Take 1 capsule (10 mg total) by mouth daily.  Dispense: 30 capsule; Refill: 3 Continue- hydrOXYzine (ATARAX) 10 MG tablet; Take 1 tablet (10 mg total) by mouth 3 (three) times daily as needed.  Dispense: 90 tablet; Refill: 3   Collaboration of Care: Collaboration of Care: Other provider involved in patient's care AEB Counselor  Patient/Guardian was advised Release of Information must be obtained prior to any record release in order to collaborate their care with an outside provider. Patient/Guardian was advised if they have not already done so to contact the registration department to sign all necessary forms in order for [place] to release information regarding their  care.   Consent: Patient/Guardian gives verbal consent for treatment and assignment of benefits for services provided during this visit. Patient/Guardian expressed understanding and agreed to proceed.    Shanna Cisco, NP 12/15/2021, 12:42 PM

## 2022-01-30 ENCOUNTER — Ambulatory Visit (HOSPITAL_COMMUNITY): Payer: No Payment, Other | Admitting: Licensed Clinical Social Worker

## 2022-03-13 ENCOUNTER — Other Ambulatory Visit (HOSPITAL_COMMUNITY): Payer: Self-pay | Admitting: Psychiatry

## 2022-03-13 DIAGNOSIS — F411 Generalized anxiety disorder: Secondary | ICD-10-CM

## 2022-03-17 ENCOUNTER — Telehealth (INDEPENDENT_AMBULATORY_CARE_PROVIDER_SITE_OTHER): Payer: No Payment, Other | Admitting: Psychiatry

## 2022-03-17 ENCOUNTER — Encounter (HOSPITAL_COMMUNITY): Payer: Self-pay

## 2022-03-17 ENCOUNTER — Encounter (HOSPITAL_COMMUNITY): Payer: Self-pay | Admitting: Psychiatry

## 2022-03-17 DIAGNOSIS — F411 Generalized anxiety disorder: Secondary | ICD-10-CM | POA: Diagnosis not present

## 2022-03-17 MED ORDER — FLUOXETINE HCL 10 MG PO CAPS: 10.0000 mg | ORAL_CAPSULE | Freq: Every day | ORAL | 3 refills | Status: DC

## 2022-03-17 MED ORDER — HYDROXYZINE HCL 10 MG PO TABS: 10.0000 mg | ORAL_TABLET | Freq: Three times a day (TID) | ORAL | 3 refills | Status: DC | PRN

## 2022-03-17 NOTE — Progress Notes (Signed)
[place] MD/PA/NP OP Progress Note Virtual Visit via Telephone Note  I connected with Chad Keller on 03/17/22 at 10:30 AM EDT by telephone and verified that I am speaking with the correct person using two identifiers.  Location: Patient: home Provider: Clinic   I discussed the limitations, risks, security and privacy concerns of performing an evaluation and management service by telephone and the availability of in person appointments. I also discussed with the patient that there may be a patient responsible charge related to this service. The patient expressed understanding and agreed to proceed.   I provided 30 minutes of non-face-to-face time during this encounter.  03/17/2022 10:22 AM Chad Keller  MRN:  [id number]  Chief Complaint: "There are not a lot of good things happening"  HPI: 56 year old male seen today for follow up psychiatric evaluation. He has a psychiatric history of adjustment disorder, anxiety, and depression. He is currently managed on hydroxyzine 10 mg three times daily as needed and Prozac 10 mg daily. He notes that his medications are effective in managing his psychiatric conditions.  Today patient was unable to logon virtually so his assessment was done over the phone.  During exam he was pleasant, cooperative, and engaged in conversation.  He informed Probation officer that there are a lot of good things happening.  He notes that recently he found out that his son was excepted into college.  He notes that he is a proud father of his son and 2 other children.  Patient informed Probation officer that he finds his medications effective and reports that he is feeling really happy.  Provider conducted a GAD-7 and patient scored a 2, at his last visit he scored a 6.  Provider also conducted PHQ-9 and patient scored a 2, at his last visit he scored a 2.  He endorses adequate sleep and appetite.  Today he denies SI/HI/VAH, mania, or paranoia.  Patient informed Probation officer that yesterday he experienced  a delay in his speech.  He notes that this happened once 3 years ago.  He informed Probation officer that when he went to the emergency room to check on it 3 years ago they said it was stress-induced.  He denies one-sided weakness, chest pain, or numbness.  Provider encouraged patient to follow-up with PCP if it occurs again.  He endorsed understanding and agreed.  No medication changes made today.  Patient agreeable to continue medication as prescribed.  Visit Diagnosis:    ICD-10-CM   1. Generalized anxiety disorder  F41.1 FLUoxetine (PROZAC) 10 MG capsule    hydrOXYzine (ATARAX) 10 MG tablet      Past Psychiatric History: Adjustment disorder, anxiety, depression,  Past Medical History: No past medical history on file. No past surgical history on file.  Family Psychiatric History: Brother schizophrenia  Family History: No family history on file.  Social History:  Social History   Socioeconomic History   Marital status: Single    Spouse name: Not on file   Number of children: Not on file   Years of education: Not on file   Highest education level: Not on file  Occupational History   Not on file  Tobacco Use   Smoking status: Never   Smokeless tobacco: Not on file  Substance and Sexual Activity   Alcohol use: Yes    Comment: occasional   Drug use: No   Sexual activity: Never  Other Topics Concern   Not on file  Social History Narrative   Not on file   Social Determinants  of Health   Financial Resource Strain: Not on file  Food Insecurity: Not on file  Transportation Needs: Not on file  Physical Activity: Not on file  Stress: Not on file  Social Connections: Not on file    Allergies: No Known Allergies  Metabolic Disorder Labs: No results found for: "HGBA1C", "MPG" No results found for: "PROLACTIN" No results found for: "CHOL", "TRIG", "HDL", "CHOLHDL", "VLDL", "LDLCALC" No results found for: "TSH"  Therapeutic Level Labs: No results found for: "LITHIUM" No results  found for: "VALPROATE" No results found for: "CBMZ"  Current Medications: Current Outpatient Medications  Medication Sig Dispense Refill   fluconazole (DIFLUCAN) 150 MG tablet 1 tab po weekly x4 4 tablet 0   FLUoxetine (PROZAC) 10 MG capsule Take 1 capsule (10 mg total) by mouth daily. 90 capsule 3   hydrOXYzine (ATARAX) 10 MG tablet Take 1 tablet (10 mg total) by mouth 3 (three) times daily as needed. 90 tablet 3   No current facility-administered medications for this visit.     Musculoskeletal: Strength & Muscle Tone:  Unable to assess due to telephone visit Gait & Station:  Unable to assess due to telephone visit Patient leans: N/A  Psychiatric Specialty Exam: Review of Systems  There were no vitals taken for this visit.There is no height or weight on file to calculate BMI.  General Appearance:  Unable to assess due to telephone visit  Eye Contact:   Unable to assess due to telephone visit  Speech:  Clear and Coherent and Normal Rate  Volume:  Normal  Mood:  Euthymic  Affect:  Appropriate and Congruent  Thought Process:  Coherent, Goal Directed, and Linear  Orientation:  Full (Time, Place, and Person)  Thought Content: WDL and Logical   Suicidal Thoughts:  No  Homicidal Thoughts:  No  Memory:  Immediate;   Good Recent;   Good Remote;   Good  Judgement:  Good  Insight:  Good  Psychomotor Activity:   Unable to assess due to telephone visit  Concentration:  Concentration: Good and Attention Span: Good  Recall:  Good  Fund of Knowledge: Good  Language: Good  Akathisia:  Unable to assess due to telephone visit  Handed:  Right  AIMS (if indicated): not done  Assets:  Communication Skills Desire for Improvement Financial Resources/Insurance Housing Physical Health Resilience Talents/Skills Transportation  ADL's:  Intact  Cognition: WNL  Sleep:  Good   Screenings: GAD-7    Flowsheet Row Video Visit from 03/17/2022 in [facility]  Video Visit from 12/15/2021 in [facility] Office Visit from 07/12/2021 in [facility]  Total GAD-7 Score 2 6 14       PHQ2-9    Flowsheet Row Video Visit from 03/17/2022 in [facility] Video Visit from 12/15/2021 in [facility] Office Visit from 07/12/2021 in [facility] Counselor from 07/08/2021 in [facility]  PHQ-2 Total Score 0 0 0 1  PHQ-9 Total Score 2 2 5  --      Flowsheet Row ED from 08/29/2021 in [facility] Office Visit from 07/12/2021 in [facility] Counselor from 07/08/2021 in [facility]  C-SSRS RISK CATEGORY No Risk No Risk No Risk        Assessment and Plan: Patient notes that he is doing well on his current medication regimen.  No medication changes made today.  Patient agreeable to continue medication as prescribed.  1. Generalized anxiety disorder  Continue- FLUoxetine (PROZAC) 10 MG capsule; Take 1 capsule (10 mg total) by mouth daily.  Dispense: 30 capsule; Refill: 3 Continue- hydrOXYzine (ATARAX) 10 MG tablet; Take 1 tablet (10 mg total) by mouth 3 (three) times daily as needed.  Dispense: 90 tablet; Refill: 3    Follow-up in 3 months  Shanna Cisco, NP 03/17/2022, 10:22 AM

## 2022-04-02 ENCOUNTER — Other Ambulatory Visit (HOSPITAL_COMMUNITY): Payer: Self-pay | Admitting: Psychiatry

## 2022-04-02 DIAGNOSIS — F411 Generalized anxiety disorder: Secondary | ICD-10-CM

## 2022-06-13 ENCOUNTER — Telehealth (HOSPITAL_COMMUNITY): Payer: No Payment, Other | Admitting: Psychiatry

## 2022-06-16 ENCOUNTER — Telehealth (HOSPITAL_COMMUNITY): Payer: No Payment, Other | Admitting: Psychiatry

## 2023-04-13 IMAGING — CR DG WRIST COMPLETE 3+V*L*
4 series · 4 of 4 positions shown · non-contrast
Comparison: None.

CLINICAL DATA: Trauma

EXAM:
LEFT WRIST - COMPLETE 3+ VIEW

[x wrist pa left]
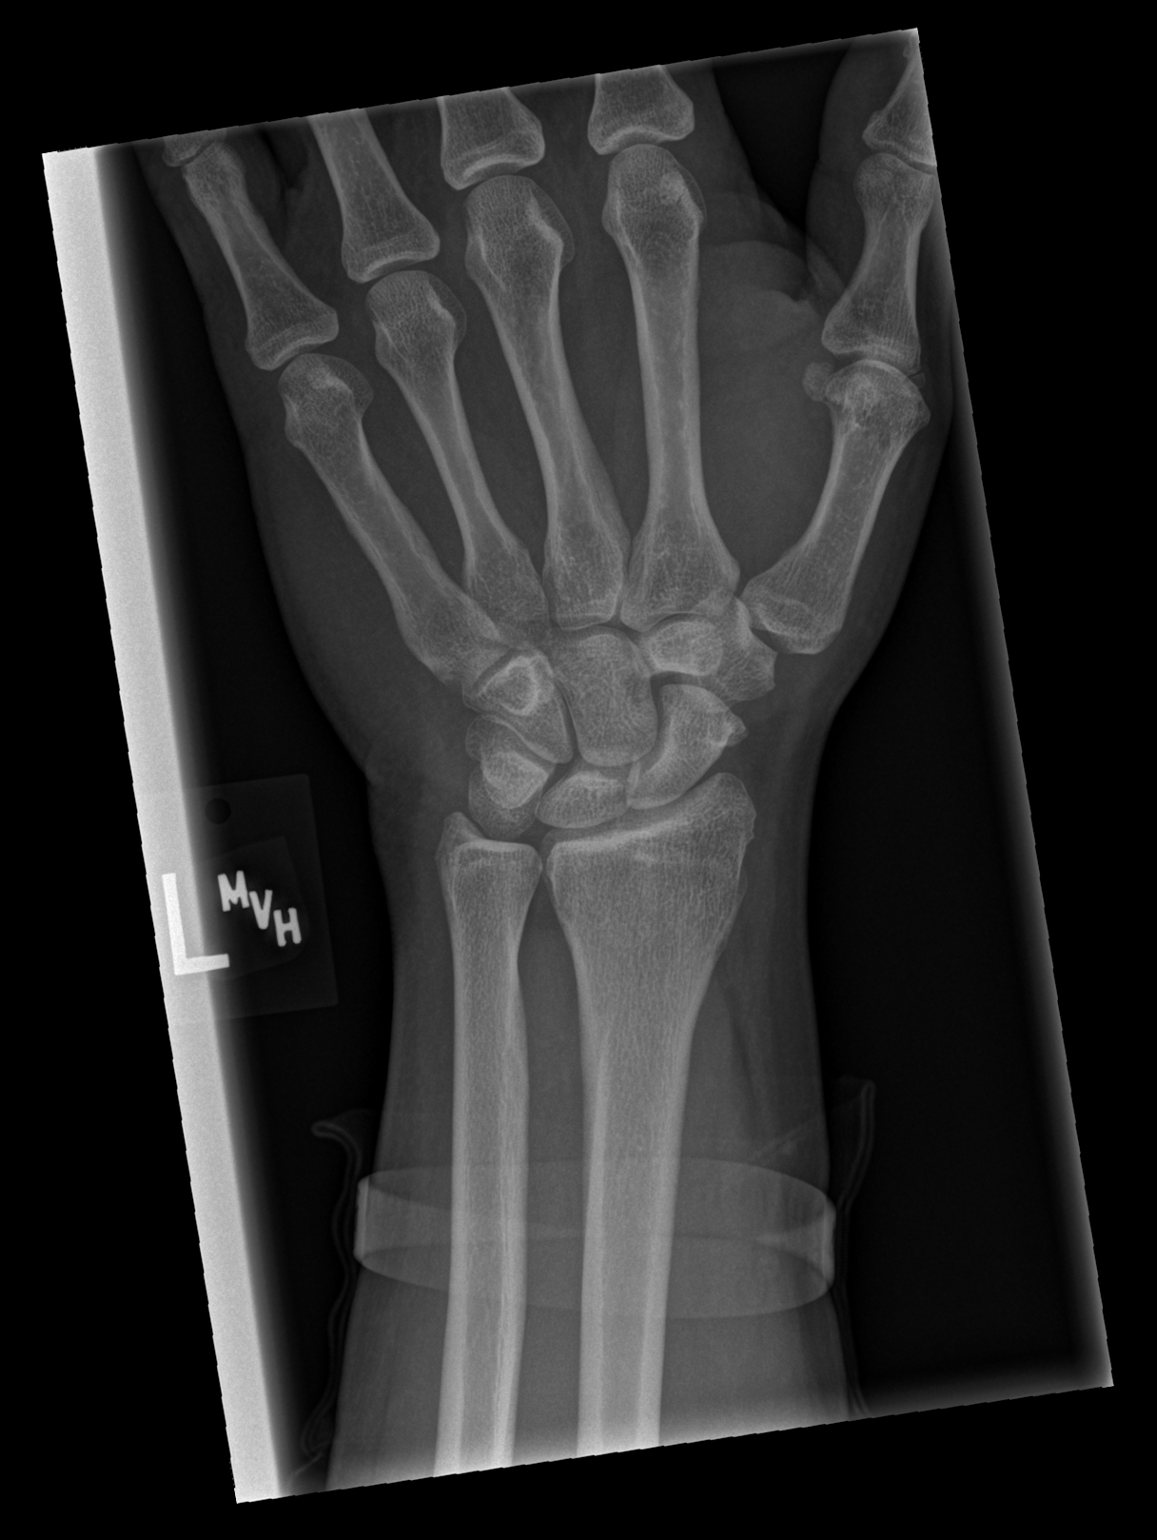

[x wrist obl left]
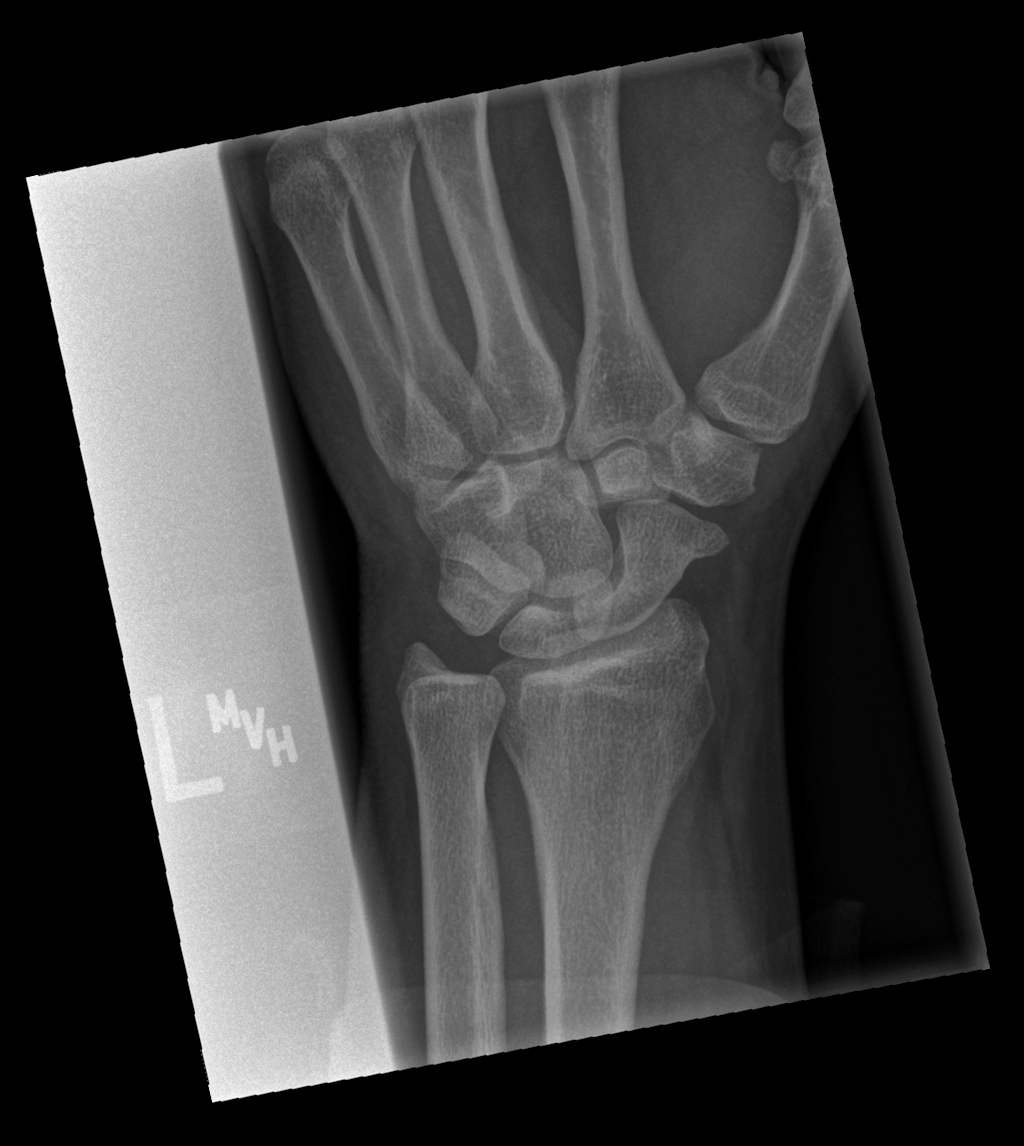

[x wrist lat left]
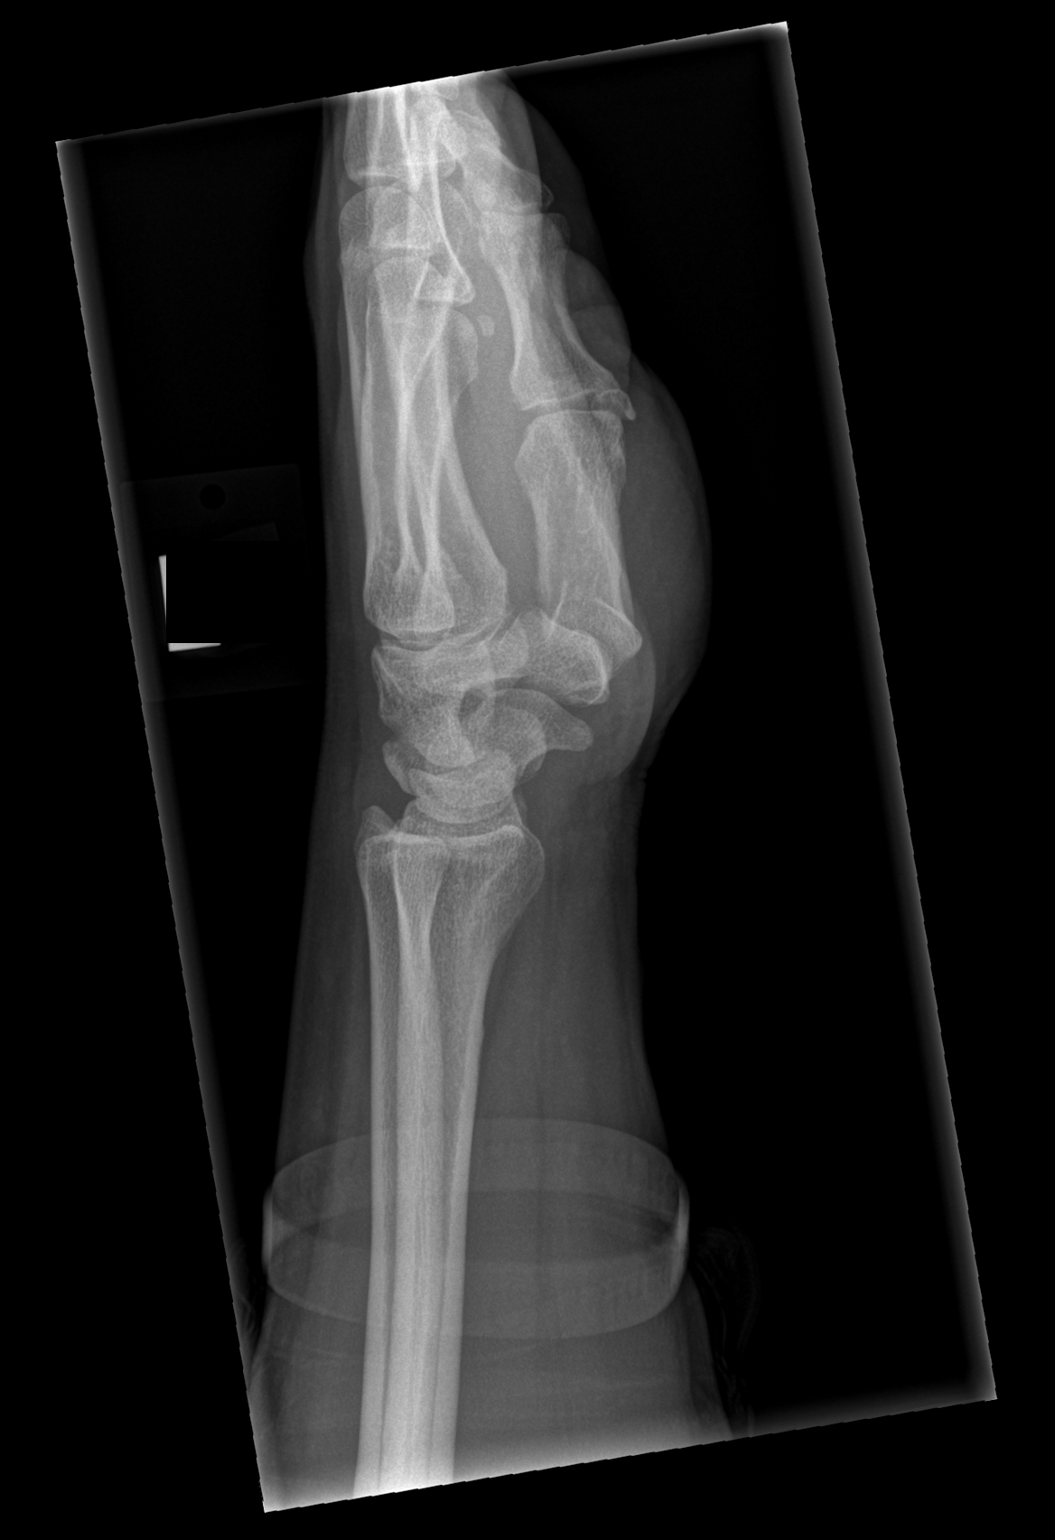

[x wrist navicular view left]
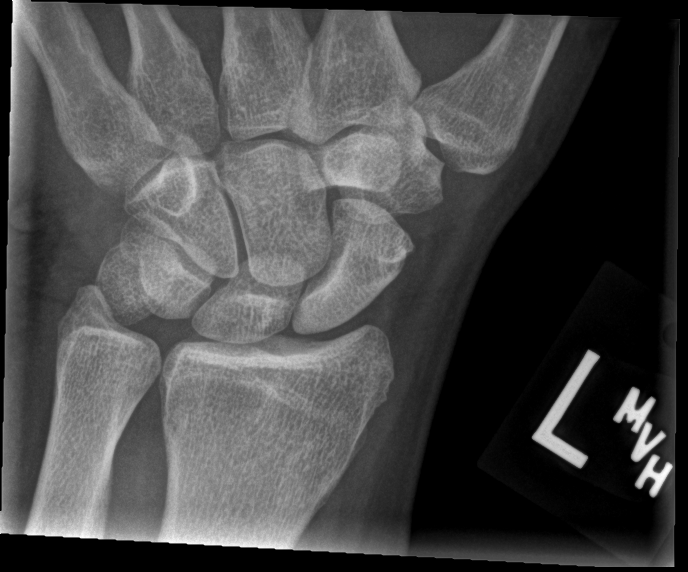

[4 of 4 positions shown; findings below may reference images not displayed]

FINDINGS: There is no evidence of fracture or dislocation. There is no
evidence of arthropathy or other focal bone abnormality. Soft
tissues are unremarkable.
IMPRESSION: No acute osseous abnormality identified.

## 2023-09-03 ENCOUNTER — Ambulatory Visit (HOSPITAL_COMMUNITY): Admitting: Licensed Clinical Social Worker

## 2023-09-03 ENCOUNTER — Encounter (HOSPITAL_COMMUNITY): Payer: Self-pay | Admitting: Licensed Clinical Social Worker

## 2023-09-03 DIAGNOSIS — F411 Generalized anxiety disorder: Secondary | ICD-10-CM
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
## 2023-09-04 ENCOUNTER — Ambulatory Visit (INDEPENDENT_AMBULATORY_CARE_PROVIDER_SITE_OTHER): Admitting: Physician Assistant

## 2023-09-04 ENCOUNTER — Encounter (HOSPITAL_COMMUNITY): Payer: Self-pay | Admitting: Physician Assistant

## 2023-09-04 VITALS — BP 125/85 | HR 68 | Temp 98.5°F | Ht 68.0 in | Wt 237.0 lb

## 2023-09-04 DIAGNOSIS — F411 Generalized anxiety disorder: Secondary | ICD-10-CM | POA: Diagnosis not present

## 2023-09-04 DIAGNOSIS — Z758 Other problems related to medical facilities and other health care: Secondary | ICD-10-CM | POA: Diagnosis not present

## 2023-09-04 DIAGNOSIS — F4323 Adjustment disorder with mixed anxiety and depressed mood: Secondary | ICD-10-CM | POA: Diagnosis not present

## 2023-09-04 MED ORDER — HYDROXYZINE HCL 10 MG PO TABS: 10.0000 mg | ORAL_TABLET | Freq: Three times a day (TID) | ORAL | 1 refills | Status: DC | PRN

## 2023-09-04 MED ORDER — FLUOXETINE HCL 10 MG PO CAPS: 10.0000 mg | ORAL_CAPSULE | Freq: Every day | ORAL | 1 refills | Status: DC
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
## 2023-09-26 ENCOUNTER — Other Ambulatory Visit (HOSPITAL_COMMUNITY): Payer: Self-pay | Admitting: Physician Assistant

## 2023-09-26 DIAGNOSIS — F411 Generalized anxiety disorder: Secondary | ICD-10-CM

## 2023-10-10 NOTE — Progress Notes (Deleted)
 BH MD Outpatient Progress Note  10/10/2023 9:07 PM Chad Keller  MRN:  [id number]  Assessment:  Chad Keller is a 58 y.o. male with a history of *** who is an established patient with [facility] for medication management. On initial visit, ***  Today, 10/10/2023, ***  Plan:  # *** Past medication trials:  Status of problem: *** Interventions: -- ***  # *** Past medication trials:  Status of problem: *** Interventions: -- ***  # *** Past medication trials:  Status of problem: *** Interventions: -- ***  Return to care in ***  Patient was given contact information for behavioral health clinic and was instructed to call 911 for emergencies.    Patient and plan of care will be discussed with the Attending MD, Dr. ***, who agrees with the above statement and plan.   Subjective:  Chief Complaint: Medication Management   Interval History: ***  Visit Diagnosis: No diagnosis found.  Past Psychiatric History:  Diagnoses: *** Medication trials: *** Previous psychiatrist/therapist: *** Hospitalizations: *** Suicide attempts: *** SIB: *** Hx of violence towards others: *** Current access to guns: *** Hx of trauma/abuse: *** Substance use: ***  Past Medical History:  No past medical history on file.  No past surgical history on file.  Family Psychiatric History: ***  Family History:  No family history on file.  Social History:  Academic/Vocational: *** Social History   Socioeconomic History   Marital status: Single    Spouse name: Not on file   Number of children: Not on file   Years of education: Not on file   Highest education level: Not on file  Occupational History   Not on file  Tobacco Use   Smoking status: Never   Smokeless tobacco: Not on file  Substance and Sexual Activity   Alcohol use: Yes    Comment: occasional   Drug use: No   Sexual activity: Not Currently    Partners: Female  Other Topics  Concern   Not on file  Social History Narrative   Not on file   Social Drivers of Health   Financial Resource Strain: Low Risk  (09/03/2023)   Overall Financial Resource Strain (CARDIA)    Difficulty of Paying Living Expenses: Not very hard  Food Insecurity: No Food Insecurity (09/03/2023)   Hunger Vital Sign    Worried About Running Out of Food in the Last Year: Never true    Ran Out of Food in the Last Year: Never true  Transportation Needs: No Transportation Needs (09/03/2023)   PRAPARE - Administrator, Civil Service (Medical): No    Lack of Transportation (Non-Medical): No  Physical Activity: Inactive (09/03/2023)   Exercise Vital Sign    Days of Exercise per Week: 0 days    Minutes of Exercise per Session: 0 min  Stress: Stress Concern Present (09/03/2023)   [employer] of Occupational Health - Occupational Stress Questionnaire    Feeling of Stress : To some extent  Social Connections: Moderately Integrated (09/03/2023)   Social Connection and Isolation Panel [NHANES]    Frequency of Communication with Friends and Family: More than three times a week    Frequency of Social Gatherings with Friends and Family: More than three times a week    Attends Religious Services: More than 4 times per year    Active Member of [employer] or Organizations: Yes    Attends Banker Meetings: More than 4 times per year  Marital Status: Divorced    Allergies:  No Known Allergies  Current Medications: Current Outpatient Medications  Medication Sig Dispense Refill   fluconazole (DIFLUCAN) 150 MG tablet 1 tab po weekly x4 4 tablet 0   FLUoxetine (PROZAC) 10 MG capsule TAKE 1 CAPSULE BY MOUTH EVERY DAY 90 capsule 1   hydrOXYzine (ATARAX) 10 MG tablet Take 1 tablet (10 mg total) by mouth 3 (three) times daily as needed. 75 tablet 1   No current facility-administered medications for this visit.    ROS: Review of Systems ***  Objective:  Psychiatric Specialty  Exam: There were no vitals taken for this visit.There is no height or weight on file to calculate BMI.  General Appearance: {Appearance:22683}  Eye Contact:  {BHH EYE CONTACT:22684}  Speech:  {Speech:22685}  Volume:  {Volume (PAA):22686}  Mood:  {BHH MOOD:22306}  Affect:  {Affect (PAA):22687}  Thought Content: {Thought Content:22690}   Suicidal Thoughts:  {ST/HT (PAA):22692}  Homicidal Thoughts:  {ST/HT (PAA):22692}  Thought Process:  {Thought Process (PAA):22688}  Orientation:  {BHH ORIENTATION (PAA):22689}  Judgment:  {Judgement (PAA):22694}  Insight:  {Insight (PAA):22695}  Concentration:  {Concentration:21399}  Fund of Knowledge: {BHH GOOD/FAIR/POOR:22877}  Language: {BHH GOOD/FAIR/POOR:22877}  Psychomotor Activity:  {Psychomotor (PAA):22696}  Akathisia:  {BHH YES OR NO:22294}  AIMS (if indicated): {Desc; done/not:10129}  Assets:  {Assets (PAA):22698}  ADL's:  {BHH ZOX'W:96045}  Cognition: {chl bhh cognition:304700322}    PE: General: well-appearing; no acute distress *** Pulm: no increased work of breathing on room air *** Strength & Muscle Tone: {desc; muscle tone:32375} Neuro: no focal neurological deficits observed *** Gait & Station: {PE GAIT ED WUJW:11914}    Screenings: GAD-7    Flowsheet Row Office Visit from 09/04/2023 in [facility] Counselor from 09/03/2023 in [facility] Video Visit from 03/17/2022 in [facility] Video Visit from 12/15/2021 in [facility] Office Visit from 07/12/2021 in [facility]  Total GAD-7 Score 9 9 2 6 14       PHQ2-9    Flowsheet Row Office Visit from 09/04/2023 in [facility] Counselor from 09/03/2023 in [facility] Video Visit from 03/17/2022 in [facility] Video Visit from 12/15/2021 in [facility] Office Visit from 07/12/2021 in [facility]  PHQ-2 Total Score 1 1 0 0 0  PHQ-9 Total Score -- -- 2 2 5       Flowsheet Row Office Visit from 09/04/2023 in [facility] Counselor from 09/03/2023 in [facility] ED from 08/29/2021 in [facility] Emergency Department at [facility]  C-SSRS RISK CATEGORY No Risk No Risk No Risk       Augusta Blizzard, MD 10/10/2023, 9:07 PM

## 2023-10-11 ENCOUNTER — Encounter (HOSPITAL_COMMUNITY): Payer: Self-pay

## 2023-10-11 ENCOUNTER — Ambulatory Visit (HOSPITAL_COMMUNITY): Admitting: Student

## 2023-10-12 ENCOUNTER — Ambulatory Visit (HOSPITAL_COMMUNITY): Admitting: Licensed Clinical Social Worker

## 2023-10-12 DIAGNOSIS — F4323 Adjustment disorder with mixed anxiety and depressed mood: Secondary | ICD-10-CM | POA: Diagnosis not present

## 2023-10-12 NOTE — Progress Notes (Signed)
 THERAPIST PROGRESS NOTE  Virtual Visit via Video Note  I connected with Chad Keller on 10/12/23 at 10:00 AM EDT by a video enabled telemedicine application and verified that I am speaking with the correct person using two identifiers.  Location: Patient: [facility]  Provider: Providers Home    I discussed the limitations of evaluation and management by telemedicine and the availability of in person appointments. The patient expressed understanding and agreed to proceed.     I discussed the assessment and treatment plan with the patient. The patient was provided an opportunity to ask questions and all were answered. The patient agreed with the plan and demonstrated an understanding of the instructions.   The patient was advised to call back or seek an in-person evaluation if the symptoms worsen or if the condition fails to improve as anticipated.  I provided 40 minutes of non-face-to-face time during this encounter.   Maryagnes Small, LCSW   Participation Level: Active  Behavioral Response: CasualAlertAnxious and Depressed  Type of Therapy: Individual Therapy  Treatment Goals addressed:  Active     Anxiety     STG: Chad Keller will practice problem solving skills 3 times per week for the next 4 weeks.  (Progressing)     Start:  10/12/23    Expected End:  04/25/24         STG: Chad Keller will reduce frequency of avoidant behaviors by 50% as evidenced by self-report in therapy sessions (Progressing)     Start:  10/12/23    Expected End:  04/25/24         identify 3 triggers for anxiety  (Progressing)     Start:  10/12/23    Expected End:  04/25/24         identify 3 coping skills for anxiety  (Progressing)     Start:  10/12/23    Expected End:  04/25/24          Resolved     Anxiety Disorder CCP Problem  1 Learn and Apply Coping Skills to Decrease Anxiety Symptoms       LTG: Patient will score less than 5 on the Generalized Anxiety Disorder 7  Scale (GAD-7) (Not Applicable)     Start:  11/30/21    Expected End:  06/01/22    Resolved:  11/30/21      STG: Patient will participate in at least 80% of scheduled individual psychotherapy sessions (Not Applicable)     Start:  11/30/21    Expected End:  06/01/22    Resolved:  11/30/21      STG: Patient will complete at least 80% of assigned homework (Not Applicable)     Start:  11/30/21    Expected End:  06/01/22    Resolved:  11/30/21      Work with patient to identify cognitive distortions they are currently using and identify reframing statements to replace them. (Completed)     Start:  11/30/21    End:  10/12/23      Encourage patient to take psychotropic medication as prescribed (Completed)     Start:  11/30/21    End:  10/12/23      Perform psychoeducation regarding anxiety disorders (Completed)     Start:  11/30/21    End:  10/12/23      Work with patient to identify the major components of a recent episode of anxiety: physical symptoms, major thoughts and images, and major behaviors they experienced (Completed)     Start:  11/30/21    End:  10/12/23         ProgressTowards Goals: Progressing  Interventions: CBT, Motivational Interviewing, and Supportive   Suicidal/Homicidal: Nowithout intent/plan  Therapist Response:   Chad Keller was alert and oriented x 5.  He was pleasant, cooperative, maintained good eye contact.  He engaged well in therapy session with dressed casually.  Patient presented with anxious mood\affect.  Patient reports things have been much better since starting his medication regime again.  He reports decreased anxiety for tension, worry and increased sleep.  He reports that he is taking his medications regularly and consistently.  Patient reports that he does not feel like it is too much or too little in regards to his medications.  Chad Keller reports that he would like to improve on setting healthy boundaries.  Patient reports that he is  struggling saying "now".  LCSW and patient reviewed today setting healthy boundaries by reviewing worksheet from therapist [employer] called "setting boundaries how to say now".  LCSW and patient reviewed scenarios and examples of how to say no and different ways to present them such as utilizing body language and tone of voice.  Parents reports that he can utilize this with his brother who he is the primary caregiver for due to having schizophrenia.    Plan: Return again in 4 weeks.  Diagnosis: Adjustment disorder with mixed anxiety and depressed mood  Collaboration of Care: Other None today   Patient/Guardian was advised Release of Information must be obtained prior to any record release in order to collaborate their care with an outside provider. Patient/Guardian was advised if they have not already done so to contact the registration department to sign all necessary forms in order for us  to release information regarding their care.   Consent: Patient/Guardian gives verbal consent for treatment and assignment of benefits for services provided during this visit. Patient/Guardian expressed understanding and agreed to proceed.   Maryagnes Small, LCSW 10/12/2023

## 2023-10-24 ENCOUNTER — Other Ambulatory Visit (HOSPITAL_COMMUNITY): Payer: Self-pay | Admitting: Physician Assistant

## 2023-10-24 DIAGNOSIS — F411 Generalized anxiety disorder: Secondary | ICD-10-CM

## 2023-11-14 ENCOUNTER — Ambulatory Visit (INDEPENDENT_AMBULATORY_CARE_PROVIDER_SITE_OTHER): Admitting: Licensed Clinical Social Worker

## 2023-11-14 DIAGNOSIS — F4323 Adjustment disorder with mixed anxiety and depressed mood: Secondary | ICD-10-CM

## 2023-11-14 NOTE — Progress Notes (Signed)
 THERAPIST PROGRESS NOTE  Virtual Visit via Video Note  I connected with Chad Keller on 11/14/23 at 11:00 AM EDT by a video enabled telemedicine application and verified that I am speaking with the correct person using two identifiers.  Location: Patient: Chad Keller  Provider: Providers Home    I discussed the limitations of evaluation and management by telemedicine and the availability of in person appointments. The patient expressed understanding and agreed to proceed.     I discussed the assessment and treatment plan with the patient. The patient was provided an opportunity to ask questions and all were answered. The patient agreed with the plan and demonstrated an understanding of the instructions.   The patient was advised to call back or seek an in-person evaluation if the symptoms worsen or if the condition fails to improve as anticipated.  I provided 45 minutes of non-face-to-face time during this encounter.   Maryagnes Small, LCSW   Participation Level: Active  Behavioral Response: CasualAlertAnxious and Depressed  Type of Therapy: Individual Therapy  Treatment Goals addressed:  Active     Anxiety     STG: Chad Keller will practice problem solving skills 3 times per week for the next 4 weeks.  (Progressing)     Start:  10/12/23    Expected End:  04/25/24         STG: Chad Keller will reduce frequency of avoidant behaviors by 50% as evidenced by self-report in therapy sessions (Progressing)     Start:  10/12/23    Expected End:  04/25/24         identify 3 triggers for anxiety  (Progressing)     Start:  10/12/23    Expected End:  04/25/24         identify 3 coping skills for anxiety  (Progressing)     Start:  10/12/23    Expected End:  04/25/24          Resolved     Anxiety Disorder CCP Problem  1 Learn and Apply Coping Skills to Decrease Anxiety Symptoms       LTG: Patient will score less than 5 on the Generalized Anxiety Disorder 7  Scale (GAD-7) (Not Applicable)     Start:  11/30/21    Expected End:  06/01/22    Resolved:  11/30/21      STG: Patient will participate in at least 80% of scheduled individual psychotherapy sessions (Not Applicable)     Start:  11/30/21    Expected End:  06/01/22    Resolved:  11/30/21      STG: Patient will complete at least 80% of assigned homework (Not Applicable)     Start:  11/30/21    Expected End:  06/01/22    Resolved:  11/30/21      Work with patient to identify cognitive distortions they are currently using and identify reframing statements to replace them. (Completed)     Start:  11/30/21    End:  10/12/23      Encourage patient to take psychotropic medication as prescribed (Completed)     Start:  11/30/21    End:  10/12/23      Perform psychoeducation regarding anxiety disorders (Completed)     Start:  11/30/21    End:  10/12/23      Work with patient to identify the major components of a recent episode of anxiety: physical symptoms, major thoughts and images, and major behaviors they experienced (Completed)     Start:  11/30/21    End:  10/12/23         ProgressTowards Goals: Progressing  Interventions: CBT, Motivational Interviewing, and Supportive   Suicidal/Homicidal: Nowithout intent/plan  Therapist Response:    Chad Keller was alert and oriented x 5.  He was pleasant, cooperative, maintained good eye contact.  Patient engaged well in therapy session was dressed casually.  Patient presented with anxious mood\affect.  Patient reports that everything has been going "well".  He states that he is getting his son back on track in school as evidenced by his son improving his grades.  Chad Keller reports that he would like to improve on his financial situation such as not working at this time.  He states that he would like to start looking for employment opportunities with the right schedule.  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  This has brought him enjoyment and self-worth.  Chad Keller states that he was having some feelings of grief and loss for his mother on Mother's Day but states that he was able to work through them by utilizing support systems.  Patient has stated in the past caregiver fatigue for his brother who has schizophrenia.  Patient states that his brother is doing well in his medications and "stable".  Patient reports that this helps with his mental health.  Chad Keller reports taking his medications 7 out of 7 days/week.  He reports gratefulness to LCSW as counseling has helped him "accountable".  Intervention/plan: LCSW psychoanalytic therapy for patient to express thoughts, feelings and emotions and nonjudgmental environment.  LCSW utilized supportive therapy for praise and encouragement.  LCSW utilized CBT for cognitive restructuring and reframing.  LCSW utilized strength-based therapy to review goals such as finding employment and steps to take to progress towards that goal.  Plan: Return again in 4 weeks.  Diagnosis: Adjustment disorder with mixed anxiety and depressed mood  Collaboration of Care: Other None today   Patient/Guardian was advised Release of Information must be obtained prior to any record release in order to collaborate their care with an outside provider. Patient/Guardian was advised if they have not already done so to contact the registration department to sign all necessary forms in order for us  to release information regarding their care.   Consent: Patient/Guardian gives verbal consent for treatment and assignment of benefits for services provided during this visit. Patient/Guardian expressed understanding and agreed to proceed.   Maryagnes Small, LCSW 11/14/2023

## 2023-12-13 ENCOUNTER — Ambulatory Visit (HOSPITAL_COMMUNITY): Admitting: Licensed Clinical Social Worker

## 2023-12-14 ENCOUNTER — Ambulatory Visit (HOSPITAL_COMMUNITY): Admitting: Licensed Clinical Social Worker

## 2023-12-14 DIAGNOSIS — F4323 Adjustment disorder with mixed anxiety and depressed mood: Secondary | ICD-10-CM

## 2023-12-14 NOTE — Progress Notes (Signed)
 THERAPIST PROGRESS NOTE  Virtual Visit via Video Note  I connected with Chad Keller on 12/14/23 at 10:00 AM EDT by a video enabled telemedicine application and verified that I am speaking with the correct person using two identifiers.  Location: Patient: [facility]  Provider: Providers Home    I discussed the limitations of evaluation and management by telemedicine and the availability of in person appointments. The patient expressed understanding and agreed to proceed.      I discussed the assessment and treatment plan with the patient. The patient was provided an opportunity to ask questions and all were answered. The patient agreed with the plan and demonstrated an understanding of the instructions.   The patient was advised to call back or seek an in-person evaluation if the symptoms worsen or if the condition fails to improve as anticipated.  I provided 30 minutes of non-face-to-face time during this encounter.   Chad Small, LCSW   Participation Level: Active  Behavioral Response: CasualAlertDepressed  Type of Therapy: Individual Therapy  Treatment Goals addressed:  Active     Anxiety     STG: Chad Keller will practice problem solving skills 3 times per week for the next 4 weeks.  (Progressing)     Start:  10/12/23    Expected End:  04/25/24         STG: Chad Keller will reduce frequency of avoidant behaviors by 50% as evidenced by self-report in therapy sessions (Progressing)     Start:  10/12/23    Expected End:  04/25/24         identify 3 triggers for anxiety  (Progressing)     Start:  10/12/23    Expected End:  04/25/24         identify 3 coping skills for anxiety  (Progressing)     Start:  10/12/23    Expected End:  04/25/24          Resolved     Anxiety Disorder CCP Problem  1 Learn and Apply Coping Skills to Decrease Anxiety Symptoms       LTG: Patient will score less than 5 on the Generalized Anxiety Disorder 7 Scale (GAD-7)  (Not Applicable)     Start:  11/30/21    Expected End:  06/01/22    Resolved:  11/30/21      STG: Patient will participate in at least 80% of scheduled individual psychotherapy sessions (Not Applicable)     Start:  11/30/21    Expected End:  06/01/22    Resolved:  11/30/21      STG: Patient will complete at least 80% of assigned homework (Not Applicable)     Start:  11/30/21    Expected End:  06/01/22    Resolved:  11/30/21      Work with patient to identify cognitive distortions they are currently using and identify reframing statements to replace them. (Completed)     Start:  11/30/21    End:  10/12/23      Encourage patient to take psychotropic medication as prescribed (Completed)     Start:  11/30/21    End:  10/12/23      Perform psychoeducation regarding anxiety disorders (Completed)     Start:  11/30/21    End:  10/12/23      Work with patient to identify the major components of a recent episode of anxiety: physical symptoms, major thoughts and images, and major behaviors they experienced (Completed)     Start:  11/30/21    End:  10/12/23         ProgressTowards Goals: Progressing  Interventions: CBT, Motivational Interviewing, and Supportive  Suicidal/Homicidal: Nowithout intent/plan  Therapist Response:   Chad Keller was alert and oriented x 5.  He was pleasant, cooperative, maintained good eye contact.  Parents engaged well in therapy session was dressed casually.  Patient presented with depressed and anxious mood\affect.  Patient reports that everything has been going overall well.  Chad Keller reports being consistent with his medications taking them 7/7 days per week and as prescribed.  Chad Keller reports when he gets off his medications that is when things started to go downhill for his mental health and built-up tension, worry, worthlessness and hopelessness feelings.    Chad Keller reports better handle on his son's education and he is getting all A's and B's.   Chad Keller is engaging in activities outside of the home such as working out.  Patient has been setting healthy boundaries with friends and family which has helped decrease his mental health symptoms.  Patient denies any suicidal or homicidal ideations.  Patient denies any auditory or visual hallucinations.  Intervention/plan: LCSW utilized psychoanalytic therapy for patient to express thoughts, feelings and emotions and nonjudgmental environment.  LCSW utilized supportive therapy for praise and encouragement.  LCSW utilized education on medication management taking them consistently and as prescribed.  LCSW educated patient on consistently managing time to help decrease stress and tension.  LCSW educated patient on the benefits of exercise to build self-esteem and decrease anxiety.   Plan: Return again in 4 weeks.  Diagnosis: Adjustment disorder with mixed anxiety and depressed mood  Collaboration of Care: Other None today   Patient/Guardian was advised Release of Information must be obtained prior to any record release in order to collaborate their care with an outside provider. Patient/Guardian was advised if they have not already done so to contact the registration department to sign all necessary forms in order for us  to release information regarding their care.   Consent: Patient/Guardian gives verbal consent for treatment and assignment of benefits for services provided during this visit. Patient/Guardian expressed understanding and agreed to proceed.   Chad Small, LCSW 12/14/2023

## 2024-01-09 ENCOUNTER — Ambulatory Visit (INDEPENDENT_AMBULATORY_CARE_PROVIDER_SITE_OTHER): Payer: Self-pay | Admitting: Licensed Clinical Social Worker

## 2024-01-09 DIAGNOSIS — F4323 Adjustment disorder with mixed anxiety and depressed mood: Secondary | ICD-10-CM | POA: Diagnosis not present

## 2024-01-09 NOTE — Progress Notes (Signed)
 THERAPIST PROGRESS NOTE  Virtual Visit via Video Note  I connected with Chad Keller on 01/09/24 at  9:00 AM EDT by a video enabled telemedicine application and verified that I am speaking with the correct person using two identifiers.  Location: Patient: [facility]  Provider: [facility]   I discussed the limitations of evaluation and management by telemedicine and the availability of in person appointments. The patient expressed understanding and agreed to proceed.    I discussed the assessment and treatment plan with the patient. The patient was provided an opportunity to ask questions and all were answered. The patient agreed with the plan and demonstrated an understanding of the instructions.   The patient was advised to call back or seek an in-person evaluation if the symptoms worsen or if the condition fails to improve as anticipated.  I provided 30 minutes of non-face-to-face time during this encounter.   Juliene GORMAN Patee, LCSW   Participation Level: Active  Behavioral Response: CasualAlertAnxious  Type of Therapy: Individual Therapy  Treatment Goals addressed:  Active     Anxiety     STG: Chad Keller will practice problem solving skills 3 times per week for the next 4 weeks.  (Progressing)     Start:  10/12/23    Expected End:  04/25/24         STG: Chad will reduce frequency of avoidant behaviors by 50% as evidenced by self-report in therapy sessions (Progressing)     Start:  10/12/23    Expected End:  04/25/24         identify 3 triggers for anxiety  (Progressing)     Start:  10/12/23    Expected End:  04/25/24         identify 3 coping skills for anxiety  (Progressing)     Start:  10/12/23    Expected End:  04/25/24          Resolved     Anxiety Disorder CCP Problem  1 Learn and Apply Coping Skills to Decrease Anxiety Symptoms       LTG: Patient will score less than 5 on the Generalized Anxiety Disorder 7 Scale (GAD-7)  (Not Applicable)     Start:  11/30/21    Expected End:  06/01/22    Resolved:  11/30/21      STG: Patient will participate in at least 80% of scheduled individual psychotherapy sessions (Not Applicable)     Start:  11/30/21    Expected End:  06/01/22    Resolved:  11/30/21      STG: Patient will complete at least 80% of assigned homework (Not Applicable)     Start:  11/30/21    Expected End:  06/01/22    Resolved:  11/30/21      Work with patient to identify cognitive distortions they are currently using and identify reframing statements to replace them. (Completed)     Start:  11/30/21    End:  10/12/23      Encourage patient to take psychotropic medication as prescribed (Completed)     Start:  11/30/21    End:  10/12/23      Perform psychoeducation regarding anxiety disorders (Completed)     Start:  11/30/21    End:  10/12/23      Work with patient to identify the major components of a recent episode of anxiety: physical symptoms, major thoughts and images, and major behaviors they experienced (Completed)     Start:  11/30/21  End:  10/12/23         ProgressTowards Goals: Progressing  Interventions: CBT, Motivational Interviewing, and Supportive  Suicidal/Homicidal: Nowithout intent/plan  Therapist Response:    Chad Keller was alert and oriented x 5.  He was pleasant, cooperative, maintained good eye contact.  Patient gauge well in therapy session was dressed casually.  Parents presented with euthymic mood\affect.  Patient reports stressors for physical illness that is currently unknown.  Chad Keller was at a class reunion where he started to struggle with walking.  He states that his son and other people at the gathering noticed issues with pt walking.  Chad Keller is fearful that this may be related to him over doing it in the heat that particular day.  Parents and LCSW spoke today about following up with the primary care physician such as an internal medicine specialist  or family medicine doctor.  LCSW advised patient to speak with his psychiatrist in regards to possible side effects of psychiatric medications.    Interventions: LCSW educated patient refill requests for any medications as evidenced by speaking to him about refills listed on medication labels and how to request refills with his local pharmacy.  LCSW educated patient on the importance of reporting all side effects to medication providers that have prescribed those particular medications.  LCSW educated patient on the importance of taking medications consistently and as prescribed unless side effects are indicated.   Plan: Return again in 3 weeks.  Diagnosis: Adjustment disorder with mixed anxiety and depressed mood  Collaboration of Care: Other None today   Patient/Guardian was advised Release of Information must be obtained prior to any record release in order to collaborate their care with an outside provider. Patient/Guardian was advised if they have not already done so to contact the registration department to sign all necessary forms in order for us  to release information regarding their care.   Consent: Patient/Guardian gives verbal consent for treatment and assignment of benefits for services provided during this visit. Patient/Guardian expressed understanding and agreed to proceed.   Juliene GORMAN Patee, LCSW 01/09/2024

## 2024-01-28 ENCOUNTER — Ambulatory Visit (HOSPITAL_COMMUNITY): Admitting: Psychiatry

## 2024-02-08 ENCOUNTER — Ambulatory Visit (INDEPENDENT_AMBULATORY_CARE_PROVIDER_SITE_OTHER): Admitting: Licensed Clinical Social Worker

## 2024-02-08 DIAGNOSIS — F4323 Adjustment disorder with mixed anxiety and depressed mood: Secondary | ICD-10-CM

## 2024-02-08 NOTE — Progress Notes (Signed)
 THERAPIST PROGRESS NOTE  Virtual Visit via Video Note  I connected with Chad Keller on 02/08/24 at  9:00 AM EDT by a video enabled telemedicine application and verified that I am speaking with the correct person using two identifiers.  Location: Patient: Chad Keller Provider: Providers Home    I discussed the limitations of evaluation and management by telemedicine and the availability of in person appointments. The patient expressed understanding and agreed to proceed.      I discussed the assessment and treatment plan with the patient. The patient was provided an opportunity to ask questions and all were answered. The patient agreed with the plan and demonstrated an understanding of the instructions.   The patient was advised to call back or seek an in-person evaluation if the symptoms worsen or if the condition fails to improve as anticipated.  I provided 45  minutes of non-face-to-face time during this encounter.   Juliene GORMAN Patee, LCSW   Participation Level: Active  Behavioral Response: CasualAlertAnxious and Depressed  Type of Therapy: Individual Therapy  Treatment Goals addressed:  Active     Anxiety     STG: Jachob will practice problem solving skills 3 times per week for the next 4 weeks.  (Progressing)     Start:  10/12/23    Expected End:  04/25/24         STG: Chad will reduce frequency of avoidant behaviors by 50% as evidenced by self-report in therapy sessions (Progressing)     Start:  10/12/23    Expected End:  04/25/24         identify 3 triggers for anxiety  (Progressing)     Start:  10/12/23    Expected End:  04/25/24         identify 3 coping skills for anxiety  (Progressing)     Start:  10/12/23    Expected End:  04/25/24          Resolved     Anxiety Disorder CCP Problem  1 Learn and Apply Coping Skills to Decrease Anxiety Symptoms       LTG: Patient will score less than 5 on the Generalized Anxiety Disorder 7  Scale (GAD-7) (Not Applicable)     Start:  11/30/21    Expected End:  06/01/22    Resolved:  11/30/21      STG: Patient will participate in at least 80% of scheduled individual psychotherapy sessions (Not Applicable)     Start:  11/30/21    Expected End:  06/01/22    Resolved:  11/30/21      STG: Patient will complete at least 80% of assigned homework (Not Applicable)     Start:  11/30/21    Expected End:  06/01/22    Resolved:  11/30/21      Work with patient to identify cognitive distortions they are currently using and identify reframing statements to replace them. (Completed)     Start:  11/30/21    End:  10/12/23      Encourage patient to take psychotropic medication as prescribed (Completed)     Start:  11/30/21    End:  10/12/23      Perform psychoeducation regarding anxiety disorders (Completed)     Start:  11/30/21    End:  10/12/23      Work with patient to identify the major components of a recent episode of anxiety: physical symptoms, major thoughts and images, and major behaviors they experienced (Completed)  Start:  11/30/21    End:  10/12/23         ProgressTowards Goals: Progressing  Interventions: Motivational Interviewing and Supportive  Suicidal/Homicidal: Nowithout intent/plan  Therapist Response:     Patient was alert and oriented x 5.  He was pleasant, cooperative, maintained good eye contact.  Parents engaged well in therapy session with dressed casually.  He presented with anxious and depressed mood\affect.  Lowanda reports confusion between what a provider, psychiatric provider, and primary care provider due to.  [name] reports frustration as he has been unable to get refills on his medications.  LCSW educated patient that he no-showed for one appointment and canceled his last one.  Patient reports that he only uses his medications as needed and does not need to be seen every 3 to 4 months.  LCSW educated patient on the process of  staying established with any medication providers whether it be for psychiatry or in any other field, there are rules/stipulations that patient needs to follow up and maintain his appointments in order for the refills to be provided.  LCSW educated patient that primary care physician's manage the holistic care for patients but that specialty such as psychiatry will manage 1 certain part of the body.  LCSW then provided patient with phone number for scheduling at [facility] for [phone].  LCSW advised patient to leave voicemail if no answer is provided by front of staff.  LCSW advised patient if no call back is made within 24-48 business hours patient to call back.  Patient verbalized understanding and that he was frustrated with himself.  Other stressors for Lowanda is for knowing his limits.  Patient reports that he was at a class reunion several weeks ago where he got heatstroke.  Patient ignored signs of precage stroke symptoms.  He reports that he became weak and lethargic until he was treated and a air-conditioned building with fluids.  Intervention/plan: LCSW provided patient with solution-focused therapy as evidenced by paragraph above.  LCSW provided patient with validation of feelings in session.  LCSW utilized motivational interviewing for open-ended questions and reflective listening.  LCSW provided patient with supportive therapy for praise and encouragement.  Plan: Return again in 6 weeks.  Diagnosis: Adjustment disorder with mixed anxiety and depressed mood  Collaboration of Care: Other continued individual therapy. Pt to follow up with medication mgnt team at [facility]. LCSW educated pt on how to schedule appointment    Patient/Guardian was advised Release of Information must be obtained prior to any record release in order to collaborate their care with an outside provider. Patient/Guardian was advised if they have not already done so to contact  the registration department to sign all necessary forms in order for us  to release information regarding their care.   Consent: Patient/Guardian gives verbal consent for treatment and assignment of benefits for services provided during this visit. Patient/Guardian expressed understanding and agreed to proceed.   Juliene GORMAN Patee, LCSW 02/08/2024

## 2024-03-24 ENCOUNTER — Ambulatory Visit (INDEPENDENT_AMBULATORY_CARE_PROVIDER_SITE_OTHER): Admitting: Psychiatry

## 2024-03-24 DIAGNOSIS — F411 Generalized anxiety disorder: Secondary | ICD-10-CM | POA: Diagnosis not present

## 2024-03-24 MED ORDER — FLUOXETINE HCL 10 MG PO CAPS: 10.0000 mg | ORAL_CAPSULE | Freq: Every day | ORAL | 1 refills | Status: DC

## 2024-03-24 MED ORDER — HYDROXYZINE HCL 10 MG PO TABS: 10.0000 mg | ORAL_TABLET | Freq: Three times a day (TID) | ORAL | 3 refills | Status: DC | PRN

## 2024-03-24 NOTE — Progress Notes (Signed)
 [facility] MD/PA/NP OP Progress Note  03/24/2024 8:18 AM Chad Keller  MRN:  [id number]  Chief Complaint: I need refills  HPI:  58 year old male seen today for follow up psychiatric evaluation. He has a psychiatric history of adjustment disorder, anxiety, and depression. He is currently managed on hydroxyzine  10 mg three times daily as needed and Prozac  10 mg daily. He notes that his medications are effective in managing his psychiatric conditions.   Today patient was well-groomed, pleasant, cooperative, and engaged in conversation.  He informed Clinical research associate that he is in need of medication refills.  He notes that he took his last pill last night.  Mentally patient reports that he is in a good place.  He informed Clinical research associate that his mood, anxiety, and depression are well-managed.  Today provider conducted a GAD-7 and patient scored a 3.  Provider also conducted PHQ-9 and patient scored a 2.    He endorses adequate sleep and appetite.  Today he denies SI/HI/VH, mania, or paranoia.  Patient informed Clinical research associate that he is a happy father.  He notes that his oldest daughter graduated college and it is not as a Engineer, civil (consulting).  His younger daughter works assisting with clinical trials.  He also notes that his 55 year old son will be graduating high school soon.  Patient informed Clinical research associate that this summer he fell and now has a limp.  He denies any physical pain.  He reports that he sees his primary care doctor on Octber 2nd.  No medication changes made today.  Patient agreeable to continue medication prescribed Visit Diagnosis: No diagnosis found.  Past Psychiatric History:  Adjustment disorder, anxiety, depression,   Past Medical History: No past medical history on file. No past surgical history on file.  Family Psychiatric History:  Brother schizophrenia   Family History: No family history on file.  Social History:  Social History   Socioeconomic History   Marital status: Single    Spouse name: Not on file    Number of children: Not on file   Years of education: Not on file   Highest education level: Not on file  Occupational History   Not on file  Tobacco Use   Smoking status: Never   Smokeless tobacco: Not on file  Substance and Sexual Activity   Alcohol use: Yes    Comment: occasional   Drug use: No   Sexual activity: Not Currently    Partners: Female  Other Topics Concern   Not on file  Social History Narrative   Not on file   Social Drivers of Health   Financial Resource Strain: Low Risk  (09/03/2023)   Overall Financial Resource Strain (CARDIA)    Difficulty of Paying Living Expenses: Not very hard  Food Insecurity: No Food Insecurity (09/03/2023)   Hunger Vital Sign    Worried About Running Out of Food in the Last Year: Never true    Ran Out of Food in the Last Year: Never true  Transportation Needs: No Transportation Needs (09/03/2023)   PRAPARE - Administrator, Civil Service (Medical): No    Lack of Transportation (Non-Medical): No  Physical Activity: Sufficiently Active (10/12/2023)   Exercise Vital Sign    Days of Exercise per Week: 3 days    Minutes of Exercise per Session: 60 min  Recent Concern: Physical Activity - Inactive (09/03/2023)   Exercise Vital Sign    Days of Exercise per Week: 0 days    Minutes of Exercise per Session: 0 min  Stress: No Stress Concern Present (10/12/2023)   [employer] of Occupational Health - Occupational Stress Questionnaire    Feeling of Stress : Only a little  Recent Concern: Stress - Stress Concern Present (09/03/2023)   [employer] of Occupational Health - Occupational Stress Questionnaire    Feeling of Stress : To some extent  Social Connections: Moderately Integrated (09/03/2023)   Social Connection and Isolation Panel    Frequency of Communication with Friends and Family: More than three times a week    Frequency of Social Gatherings with Friends and Family: More than three times a week    Attends  Religious Services: More than 4 times per year    Active Member of [employer] or Organizations: Yes    Attends Engineer, structural: More than 4 times per year    Marital Status: Divorced    Allergies: No Known Allergies  Metabolic Disorder Labs: No results found for: HGBA1C, MPG No results found for: PROLACTIN No results found for: CHOL, TRIG, HDL, CHOLHDL, VLDL, LDLCALC No results found for: TSH  Therapeutic Level Labs: No results found for: LITHIUM No results found for: VALPROATE No results found for: CBMZ  Current Medications: Current Outpatient Medications  Medication Sig Dispense Refill   fluconazole  (DIFLUCAN ) 150 MG tablet 1 tab po weekly x4 4 tablet 0   FLUoxetine  (PROZAC ) 10 MG capsule TAKE 1 CAPSULE BY MOUTH EVERY DAY 90 capsule 1   hydrOXYzine  (ATARAX ) 10 MG tablet Take 1 tablet (10 mg total) by mouth 3 (three) times daily as needed. 75 tablet 1   No current facility-administered medications for this visit.     Musculoskeletal: Strength & Muscle Tone: within normal limits Gait & Station: normal Patient leans: N/A  Psychiatric Specialty Exam: Review of Systems  There were no vitals taken for this visit.There is no height or weight on file to calculate BMI.  General Appearance: Well Groomed  Eye Contact:  Good  Speech:  Clear and Coherent and Normal Rate  Volume:  Normal  Mood:  Euthymic  Affect:  Appropriate and Congruent  Thought Process:  Coherent, Goal Directed, and Linear  Orientation:  Full (Time, Place, and Person)  Thought Content: WDL and Logical   Suicidal Thoughts:  No  Homicidal Thoughts:  No  Memory:  Immediate;   Good Recent;   Good Remote;   Good  Judgement:  Good  Insight:  Good  Psychomotor Activity:  Normal  Concentration:  Concentration: Good and Attention Span: Good  Recall:  Good  Fund of Knowledge: Good  Language: Good  Akathisia:  No  Handed:  Right  AIMS (if indicated): not done  Assets:   Communication Skills Desire for Improvement Financial Resources/Insurance Housing Intimacy Leisure Time Physical Health Social Support  ADL's:  Intact  Cognition: WNL  Sleep:  Good   Screenings: GAD-7    Advertising copywriter from 10/12/2023 in [facility] Office Visit from 09/04/2023 in [facility] Counselor from 09/03/2023 in [facility] Video Visit from 03/17/2022 in [facility] Video Visit from 12/15/2021 in [facility]  Total GAD-7 Score 0 9 9 2 6    PHQ2-9    Flowsheet Row Office Visit from 09/04/2023 in [facility] Counselor from 09/03/2023 in [facility] Video Visit from 03/17/2022 in [facility] Video Visit from 12/15/2021 in [facility] Office Visit from 07/12/2021  in [facility]  PHQ-2 Total Score 1 1 0 0 0  PHQ-9 Total Score -- -- 2 2 5    Flowsheet Row Office Visit from 09/04/2023 in [facility] Counselor from 09/03/2023 in [facility] ED from 08/29/2021 in [facility] Emergency Department at [facility]  C-SSRS RISK CATEGORY No Risk No Risk No Risk     Assessment and Plan: Patient reports that he is doing well on his current medication regimen.  No medication changes made today.  Patient agreeable to continue medication prescribed.  Collaboration of Care: Collaboration of Care: Other provider involved in patient's care AEB PCP and counselor  Patient/Guardian was advised Release of Information must be obtained prior to any record release in order to collaborate their care with an outside provider. Patient/Guardian was advised if they have not already done so to contact the registration department to sign all necessary forms in order  for us  to release information regarding their care.   Consent: Patient/Guardian gives verbal consent for treatment and assignment of benefits for services provided during this visit. Patient/Guardian expressed understanding and agreed to proceed.   Follow up in 3 months Follow up with threapy Zane FORBES Bach, NP 03/24/2024, 8:18 AM

## 2024-04-11 ENCOUNTER — Ambulatory Visit (INDEPENDENT_AMBULATORY_CARE_PROVIDER_SITE_OTHER): Payer: Self-pay | Admitting: Licensed Clinical Social Worker

## 2024-04-11 DIAGNOSIS — F4323 Adjustment disorder with mixed anxiety and depressed mood: Secondary | ICD-10-CM

## 2024-04-11 NOTE — Progress Notes (Signed)
 THERAPIST PROGRESS NOTE Virtual Visit via Video Note  I connected with [name] on 04/11/24 at  8:00 AM EDT by a video enabled telemedicine application and verified that I am speaking with the correct person using two identifiers.  Location: Patient: [facility] Provider: Providers Home    I discussed the limitations of evaluation and management by telemedicine and the availability of in person appointments. The patient expressed understanding and agreed to proceed.     I discussed the assessment and treatment plan with the patient. The patient was provided an opportunity to ask questions and all were answered. The patient agreed with the plan and demonstrated an understanding of the instructions.   The patient was advised to call back or seek an in-person evaluation if the symptoms worsen or if the condition fails to improve as anticipated.  I provided 30 minutes of non-face-to-face time during this encounter.   Juliene GORMAN Patee, LCSW   Participation Level: Active  Behavioral Response: CasualAlertAnxious and Depressed  Type of Therapy: Individual Therapy  Treatment Goals addressed:  Active     Anxiety     STG: Stetson will practice problem solving skills 3 times per week for the next 4 weeks.  (Progressing)     Start:  10/12/23    Expected End:  04/25/24         STG: Kandi will reduce frequency of avoidant behaviors by 50% as evidenced by self-report in therapy sessions (Completed/Met)     Start:  10/12/23    Expected End:  04/25/24    Resolved:  04/11/24      identify 3 triggers for anxiety  (Completed/Met)     Start:  10/12/23    Expected End:  04/25/24    Resolved:  04/11/24    Goal Note     Care giving  Illness  Childrens grades           identify 3 coping skills for anxiety  (Progressing)     Start:  10/12/23    Expected End:  04/25/24       Goal Note     Social supports  Walking           Resolved     Anxiety  Disorder CCP Problem  1 Learn and Apply Coping Skills to Decrease Anxiety Symptoms       LTG: Patient will score less than 5 on the Generalized Anxiety Disorder 7 Scale (GAD-7) (Not Applicable)     Start:  11/30/21    Expected End:  06/01/22    Resolved:  11/30/21      STG: Patient will participate in at least 80% of scheduled individual psychotherapy sessions (Not Applicable)     Start:  11/30/21    Expected End:  06/01/22    Resolved:  11/30/21      STG: Patient will complete at least 80% of assigned homework (Not Applicable)     Start:  11/30/21    Expected End:  06/01/22    Resolved:  11/30/21      Work with patient to identify cognitive distortions they are currently using and identify reframing statements to replace them. (Completed)     Start:  11/30/21    End:  10/12/23      Encourage patient to take psychotropic medication as prescribed (Completed)     Start:  11/30/21    End:  10/12/23      Perform psychoeducation regarding anxiety disorders (Completed)     Start:  11/30/21    End:  10/12/23      Work with patient to identify the major components of a recent episode of anxiety: physical symptoms, major thoughts and images, and major behaviors they experienced (Completed)     Start:  11/30/21    End:  10/12/23         ProgressTowards Goals: Progressing  Interventions: CBT, Motivational Interviewing, and Supportive   Suicidal/Homicidal: Nowithout intent/plan  Therapist Response:   [name] was alert and oriented x 5.  He was pleasant, cooperative, maintained good eye contact.  He engaged well in therapy session and was dressed casually.  He presented with euthymic mood\affect.  Lowanda comes in today stating stressors for illness.  He reports that he needs to have a follow-up with neurology due to struggles with walking. Lowanda has received from his primary care physician.  Other stressors for patient for management for his medications at [facility] and patient followed up with a medication provider to get refills on his medications.  LCSW educated patient on the importance of taking medications consistently and as prescribed.    Patient reports that caregiving for his brother has been going well.  He states that since he has managed his mental health better and prioritize himself by setting healthy boundaries his anxiety and depression has gone down.  Final stressor talked about today was for his son who is getting poor grades and has now gotten his grades up due to working with his dad and studying properly.  LCSW and patient spoke today about LCSW's transfer to [facility] health psychiatric office as of December 14.  LCSW stated that at this time goals and objectives were on pace to be completed prior to LCSW's transfer and discharging would be advised.  Discharge recommendations are based solely on therapeutic services and not medication management.  Lowanda would like to review his goals and objectives over the next 4 weeks to see if there is anything that he wants to continue to work on and will discuss it in next session.  Interventions: LCSW utilized education on referrals for neurology.  LCSW provided patient education on the benefits of what a primary care physician can do.  LCSW encouraged patient to take medications as prescribed.  Patient did ask for advice on colon guard and LCSW referred patient to speak with either a GI doctor or primary care physician for medical advice.  LCSW utilized psycho analytic therapy for patient to express thoughts, feelings and concerns and nonjudgmental environment.  LCSW validated feelings in session.  LCSW utilize strength-based therapy for patient to review goals and objectives for treatment plan.  Plan: Return again in 4 weeks.  Diagnosis: Adjustment disorder with mixed anxiety and depressed mood  Collaboration of Care: Other Continued individual therapy for  discharge session    Patient/Guardian was advised Release of Information must be obtained prior to any record release in order to collaborate their care with an outside provider. Patient/Guardian was advised if they have not already done so to contact the registration department to sign all necessary forms in order for us  to release information regarding their care.   Consent: Patient/Guardian gives verbal consent for treatment and assignment of benefits for services provided during this visit. Patient/Guardian expressed understanding and agreed to proceed.   Juliene GORMAN Patee, LCSW 04/11/2024

## 2024-04-29 ENCOUNTER — Other Ambulatory Visit (HOSPITAL_COMMUNITY): Payer: Self-pay | Admitting: Psychiatry

## 2024-04-29 DIAGNOSIS — F411 Generalized anxiety disorder: Secondary | ICD-10-CM

## 2024-05-05 ENCOUNTER — Ambulatory Visit (HOSPITAL_COMMUNITY): Admitting: Licensed Clinical Social Worker

## 2024-05-05 ENCOUNTER — Encounter (HOSPITAL_COMMUNITY): Payer: Self-pay

## 2024-06-04 ENCOUNTER — Telehealth (INDEPENDENT_AMBULATORY_CARE_PROVIDER_SITE_OTHER): Admitting: Psychiatry

## 2024-06-04 ENCOUNTER — Encounter (HOSPITAL_COMMUNITY): Payer: Self-pay | Admitting: Psychiatry

## 2024-06-04 DIAGNOSIS — F411 Generalized anxiety disorder: Secondary | ICD-10-CM

## 2024-06-04 MED ORDER — FLUOXETINE HCL 10 MG PO CAPS: 10.0000 mg | ORAL_CAPSULE | Freq: Every day | ORAL | 1 refills | Status: AC

## 2024-06-04 MED ORDER — HYDROXYZINE HCL 10 MG PO TABS: 10.0000 mg | ORAL_TABLET | Freq: Three times a day (TID) | ORAL | 1 refills | Status: AC | PRN

## 2024-06-04 NOTE — Progress Notes (Signed)
 [facility] MD/PA/NP OP Progress Note  Virtual Visit via Video Note  I connected with Chad Keller on 06/04/24 at  8:30 AM EST by a video enabled telemedicine application and verified that I am speaking with the correct person using two identifiers.  Location: Patient: Home Provider: Clinic   I discussed the limitations of evaluation and management by telemedicine and the availability of in person appointments. The patient expressed understanding and agreed to proceed.  I provided 30 minutes of non-face-to-face time during this encounter.    06/04/2024 9:46 AM Chad Keller  MRN:  [id number]  Chief Complaint: Things are fine  HPI:  58 year old male seen today for follow up psychiatric evaluation. He has a psychiatric history of adjustment disorder, anxiety, and depression. He is currently managed on hydroxyzine  10 mg three times daily as needed and Prozac  10 mg daily. He notes that his medications are effective in managing his psychiatric conditions.   Today patient was well-groomed, pleasant, cooperative, and engaged in conversation.  He informed clinical research associate that everything is fine. Provider reviewed patient medical records and saw that patient went to [facility] ED for memory changes.  He was referred to neurology.  Patient informed clinical research associate that his thoughts were jumbled.  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  He was instructed to take aspirin 81 mg daily.  He also was referred to neurology.    Since his ED visit patient notes that he has been doing well.  He informed clinical research associate that he is looking forward to spending the holidays with his daughters.  He reports that his mood, anxiety, and depression are well-managed.  Today provider conducted a GAD-7 and patient scored a 1.  Provider also conducted PHQ-9 and patient scored a 1.    He endorses adequate sleep and appetite.  Today he denies SI/HI/VH, mania, or paranoia.  No medication changes made today.   Patient agreeable to continue medication prescribed Visit Diagnosis:    ICD-10-CM   1. Generalized anxiety disorder  F41.1 hydrOXYzine  (ATARAX ) 10 MG tablet    FLUoxetine  (PROZAC ) 10 MG capsule      Past Psychiatric History:  Adjustment disorder, anxiety, depression,   Past Medical History: No past medical history on file. No past surgical history on file.  Family Psychiatric History:  Brother schizophrenia   Family History: No family history on file.  Social History:  Social History   Socioeconomic History   Marital status: Single    Spouse name: Not on file   Number of children: Not on file   Years of education: Not on file   Highest education level: Not on file  Occupational History   Not on file  Tobacco Use   Smoking status: Never   Smokeless tobacco: Not on file  Substance and Sexual Activity   Alcohol use: Yes    Comment: occasional   Drug use: No   Sexual activity: Not Currently    Partners: Female  Other Topics Concern   Not on file  Social History Narrative   Not on file   Social Drivers of Health   Financial Resource Strain: Low Risk  (09/03/2023)   Overall Financial Resource Strain (CARDIA)    Difficulty of Paying Living Expenses: Not very hard  Food Insecurity: No Food Insecurity (09/03/2023)   Hunger Vital Sign    Worried About Running Out of Food in the Last Year: Never true    Ran Out of Food in the Last Year:  Never true  Transportation Needs: No Transportation Needs (09/03/2023)   PRAPARE - Administrator, Civil Service (Medical): No    Lack of Transportation (Non-Medical): No  Physical Activity: Sufficiently Active (10/12/2023)   Exercise Vital Sign    Days of Exercise per Week: 3 days    Minutes of Exercise per Session: 60 min  Recent Concern: Physical Activity - Inactive (09/03/2023)   Exercise Vital Sign    Days of Exercise per Week: 0 days    Minutes of Exercise per Session: 0 min  Stress: No Stress Concern Present (10/12/2023)    [employer] of Occupational Health - Occupational Stress Questionnaire    Feeling of Stress : Only a little  Recent Concern: Stress - Stress Concern Present (09/03/2023)   [employer] of Occupational Health - Occupational Stress Questionnaire    Feeling of Stress : To some extent  Social Connections: Moderately Integrated (09/03/2023)   Social Connection and Isolation Panel    Frequency of Communication with Friends and Family: More than three times a week    Frequency of Social Gatherings with Friends and Family: More than three times a week    Attends Religious Services: More than 4 times per year    Active Member of [employer] or Organizations: Yes    Attends Engineer, Structural: More than 4 times per year    Marital Status: Divorced    Allergies: No Known Allergies  Metabolic Disorder Labs: No results found for: HGBA1C, MPG No results found for: PROLACTIN No results found for: CHOL, TRIG, HDL, CHOLHDL, VLDL, LDLCALC No results found for: TSH  Therapeutic Level Labs: No results found for: LITHIUM No results found for: VALPROATE No results found for: CBMZ  Current Medications: Current Outpatient Medications  Medication Sig Dispense Refill   fluconazole  (DIFLUCAN ) 150 MG tablet 1 tab po weekly x4 4 tablet 0   FLUoxetine  (PROZAC ) 10 MG capsule Take 1 capsule (10 mg total) by mouth daily. 90 capsule 1   hydrOXYzine  (ATARAX ) 10 MG tablet Take 1 tablet (10 mg total) by mouth 3 (three) times daily as needed. 270 tablet 1   No current facility-administered medications for this visit.     Musculoskeletal: Strength & Muscle Tone: within normal limits and telehealth visit Gait & Station: normal,  telehealth visit Patient leans: N/A  Psychiatric Specialty Exam: Review of Systems  There were no vitals taken for this visit.There is no height or weight on file to calculate BMI.  General Appearance: Well Groomed  Eye Contact:  Good   Speech:  Clear and Coherent and Normal Rate  Volume:  Normal  Mood:  Euthymic  Affect:  Appropriate and Congruent  Thought Process:  Coherent, Goal Directed, and Linear  Orientation:  Full (Time, Place, and Person)  Thought Content: WDL and Logical   Suicidal Thoughts:  No  Homicidal Thoughts:  No  Memory:  Immediate;   Good Recent;   Good Remote;   Good  Judgement:  Good  Insight:  Good  Psychomotor Activity:  Normal  Concentration:  Concentration: Good and Attention Span: Good  Recall:  Good  Fund of Knowledge: Good  Language: Good  Akathisia:  No  Handed:  Right  AIMS (if indicated): not done  Assets:  Communication Skills Desire for Improvement Financial Resources/Insurance Housing Intimacy Leisure Time Physical Health Social Support  ADL's:  Intact  Cognition: WNL  Sleep:  Good   Screenings: GAD-7    Flowsheet Row Video Visit from 06/04/2024 in  [facility] Clinical Support from 03/24/2024 in [facility] Counselor from 10/12/2023 in [facility] Office Visit from 09/04/2023 in [facility] Counselor from 09/03/2023 in [facility]  Total GAD-7 Score 1 3 0 9 9   PHQ2-9    Flowsheet Row Video Visit from 06/04/2024 in [facility] Clinical Support from 03/24/2024 in [facility] Office Visit from 09/04/2023 in [facility] Counselor from 09/03/2023 in [facility] Video Visit from 03/17/2022 in [facility]  PHQ-2 Total Score 0 0 1 1 0  PHQ-9 Total Score 1 2 -- -- 2   Flowsheet Row Office Visit from 09/04/2023 in [facility] Counselor from 09/03/2023 in [facility] ED from 08/29/2021 in [facility] Emergency Department at [facility]   C-SSRS RISK CATEGORY No Risk No Risk No Risk     Assessment and Plan: Patient reports that he is doing well on his current medication regimen.  No medication changes made today.  Patient agreeable to continue medication prescribed.  1. Generalized anxiety disorder  Continue- hydrOXYzine  (ATARAX ) 10 MG tablet; Take 1 tablet (10 mg total) by mouth 3 (three) times daily as needed.  Dispense: 270 tablet; Refill: 1 Continue- FLUoxetine  (PROZAC ) 10 MG capsule; Take 1 capsule (10 mg total) by mouth daily.  Dispense: 90 capsule; Refill: 1   Collaboration of Care: Collaboration of Care: Other provider involved in patient's care AEB PCP and counselor  Patient/Guardian was advised Release of Information must be obtained prior to any record release in order to collaborate their care with an outside provider. Patient/Guardian was advised if they have not already done so to contact the registration department to sign all necessary forms in order for us  to release information regarding their care.   Consent: Patient/Guardian gives verbal consent for treatment and assignment of benefits for services provided during this visit. Patient/Guardian expressed understanding and agreed to proceed.   Follow up in 3 months Follow up with threapy Zane FORBES Bach, NP 06/04/2024, 9:46 AM

## 2024-08-11 ENCOUNTER — Ambulatory Visit: Admitting: Neurology

## 2024-08-13 ENCOUNTER — Telehealth (HOSPITAL_COMMUNITY): Admitting: Psychiatry
